# Patient Record
Sex: Female | Born: 1975
Health system: Southern US, Community
[De-identification: ages and names within clinical notes are randomized; demographics above are authoritative.]

## PROBLEM LIST (undated history)

## (undated) DIAGNOSIS — K219 Gastro-esophageal reflux disease without esophagitis: Secondary | ICD-10-CM

## (undated) DIAGNOSIS — E119 Type 2 diabetes mellitus without complications: Secondary | ICD-10-CM

## (undated) DIAGNOSIS — J189 Pneumonia, unspecified organism: Secondary | ICD-10-CM

## (undated) DIAGNOSIS — I10 Essential (primary) hypertension: Secondary | ICD-10-CM

## (undated) DIAGNOSIS — Z309 Encounter for contraceptive management, unspecified: Secondary | ICD-10-CM

## (undated) DIAGNOSIS — E785 Hyperlipidemia, unspecified: Secondary | ICD-10-CM

## (undated) DIAGNOSIS — N898 Other specified noninflammatory disorders of vagina: Secondary | ICD-10-CM

## (undated) DIAGNOSIS — B9689 Other specified bacterial agents as the cause of diseases classified elsewhere: Secondary | ICD-10-CM

## (undated) DIAGNOSIS — R87629 Unspecified abnormal cytological findings in specimens from vagina: Secondary | ICD-10-CM

## (undated) DIAGNOSIS — R87610 Atypical squamous cells of undetermined significance on cytologic smear of cervix (ASC-US): Secondary | ICD-10-CM

## (undated) DIAGNOSIS — R4586 Emotional lability: Secondary | ICD-10-CM

## (undated) DIAGNOSIS — J40 Bronchitis, not specified as acute or chronic: Secondary | ICD-10-CM

## (undated) DIAGNOSIS — Z9889 Other specified postprocedural states: Secondary | ICD-10-CM

## (undated) DIAGNOSIS — N760 Acute vaginitis: Secondary | ICD-10-CM

## (undated) DIAGNOSIS — N939 Abnormal uterine and vaginal bleeding, unspecified: Secondary | ICD-10-CM

## (undated) DIAGNOSIS — R112 Nausea with vomiting, unspecified: Secondary | ICD-10-CM

## (undated) HISTORY — DX: Emotional lability: R45.86

## (undated) HISTORY — DX: Other specified bacterial agents as the cause of diseases classified elsewhere: B96.89

## (undated) HISTORY — PX: EYE SURGERY: SHX253

## (undated) HISTORY — DX: Other specified bacterial agents as the cause of diseases classified elsewhere: N76.0

## (undated) HISTORY — DX: Other specified noninflammatory disorders of vagina: N89.8

## (undated) HISTORY — PX: LAPAROSCOPIC GASTRIC SLEEVE RESECTION: SHX5895

## (undated) HISTORY — DX: Encounter for contraceptive management, unspecified: Z30.9

## (undated) HISTORY — DX: Atypical squamous cells of undetermined significance on cytologic smear of cervix (ASC-US): R87.610

## (undated) HISTORY — DX: Unspecified abnormal cytological findings in specimens from vagina: R87.629

## (undated) HISTORY — DX: Abnormal uterine and vaginal bleeding, unspecified: N93.9

## (undated) HISTORY — PX: WISDOM TOOTH EXTRACTION: SHX21

## (undated) HISTORY — DX: Hyperlipidemia, unspecified: E78.5

## (undated) HISTORY — DX: Type 2 diabetes mellitus without complications: E11.9

---

## 2004-03-24 ENCOUNTER — Ambulatory Visit (HOSPITAL_COMMUNITY): Admission: AD | Admit: 2004-03-24 | Discharge: 2004-03-24 | Payer: Self-pay | Admitting: Internal Medicine

## 2004-06-14 ENCOUNTER — Ambulatory Visit (HOSPITAL_COMMUNITY): Admission: AD | Admit: 2004-06-14 | Discharge: 2004-06-14 | Payer: Self-pay | Admitting: Obstetrics & Gynecology

## 2004-06-17 ENCOUNTER — Ambulatory Visit (HOSPITAL_COMMUNITY): Admission: AD | Admit: 2004-06-17 | Discharge: 2004-06-17 | Payer: Self-pay | Admitting: Obstetrics & Gynecology

## 2004-06-20 ENCOUNTER — Inpatient Hospital Stay (HOSPITAL_COMMUNITY): Admission: EM | Admit: 2004-06-20 | Discharge: 2004-06-23 | Payer: Self-pay | Admitting: Obstetrics & Gynecology

## 2007-08-03 ENCOUNTER — Emergency Department (HOSPITAL_COMMUNITY): Admission: EM | Admit: 2007-08-03 | Discharge: 2007-08-03 | Payer: Self-pay | Admitting: Emergency Medicine

## 2007-08-05 ENCOUNTER — Emergency Department (HOSPITAL_COMMUNITY): Admission: EM | Admit: 2007-08-05 | Discharge: 2007-08-05 | Payer: Self-pay | Admitting: Emergency Medicine

## 2008-11-20 ENCOUNTER — Emergency Department (HOSPITAL_COMMUNITY): Admission: EM | Admit: 2008-11-20 | Discharge: 2008-11-20 | Payer: Self-pay | Admitting: Family Medicine

## 2009-04-22 ENCOUNTER — Emergency Department (HOSPITAL_COMMUNITY): Admission: EM | Admit: 2009-04-22 | Discharge: 2009-04-22 | Payer: Self-pay | Admitting: Family Medicine

## 2009-09-06 ENCOUNTER — Other Ambulatory Visit: Admission: RE | Admit: 2009-09-06 | Discharge: 2009-09-06 | Payer: Self-pay | Admitting: Obstetrics and Gynecology

## 2010-08-01 ENCOUNTER — Ambulatory Visit: Payer: Self-pay | Admitting: Dietician

## 2010-08-11 ENCOUNTER — Encounter: Payer: Commercial Managed Care - PPO | Attending: Endocrinology | Admitting: Dietician

## 2010-08-11 DIAGNOSIS — E109 Type 1 diabetes mellitus without complications: Secondary | ICD-10-CM | POA: Insufficient documentation

## 2010-08-11 DIAGNOSIS — Z713 Dietary counseling and surveillance: Secondary | ICD-10-CM | POA: Insufficient documentation

## 2010-08-11 DIAGNOSIS — I10 Essential (primary) hypertension: Secondary | ICD-10-CM | POA: Insufficient documentation

## 2010-08-11 DIAGNOSIS — E785 Hyperlipidemia, unspecified: Secondary | ICD-10-CM | POA: Insufficient documentation

## 2010-11-04 NOTE — Op Note (Signed)
NAMESALAYA, HOLTROP                   ACCOUNT NO.:  [id number]   MEDICAL RECORD NO.:  [id number]          PATIENT TYPE:  INP   LOCATION:  A415                          FACILITY:  [facility]   PHYSICIAN:  Lazaro Arms, M.D.   DATE OF BIRTH:  03/28/1976   DATE OF PROCEDURE:  06/20/2004  DATE OF DISCHARGE:                                 OPERATIVE REPORT   PREOPERATIVE DIAGNOSES:  1.  Intrauterine pregnancy at 52 weeks' gestation.  2.  Premature rupture of the membranes.  3.  Inadequate pelvis.  4.  Class B diabetic.   POSTOPERATIVE DIAGNOSES:  1.  Intrauterine pregnancy at 65 weeks' gestation.  2.  Premature rupture of the membranes.  3.  Inadequate pelvis.  4.  Class B diabetic.  5.  Right oblique lie.   PROCEDURE:  Primary cesarean section.   SURGEON:  Lazaro Arms, M.D.   ANESTHESIA:  Spinal.   FINDINGS:  Over a low transverse hysterotomy incision was delivered a viable  female at 12:29 with Apgars of 8 and 9, weight to be determined in the  nursery.  There was a three-vessel cord, cord blood and cord gas were sent.  The placenta was sent to pathology per protocol.  The uterus, tubes, and  ovaries were all normal.   DESCRIPTION OF OPERATION:  The patient was taken to the operating room and  underwent a spinal anesthetic, placed in a supine position with a roll under  her right hip.  She was shaved, prepped, a Foley catheter was placed.  She  underwent DuraPrep x2 and draped in the usual sterile fashion.  A  Pfannenstiel skin incision was made and carried down sharply to the rectus  fascia, which was scored in the midline and extended laterally.  The fascia  was taken off the muscles superiorly and inferiorly without difficulty, and  the muscles were divided.  The peritoneal cavity was entered, a bladder  blade was placed.  A vesicouterine serosal flap was created.  A low  transverse hysterotomy incision was made.  Over this incision with vacuum  assistance, a live female was  delivered at 12:29 with Apgars of 8 and 9.  Dr. Milford Cage was in attendance for routine neonatal resuscitation.  The cord was  clamped and the infant was handed to Dr. Milford Cage.  The placenta was delivered  spontaneously and sent to pathology.  Cord blood and cord gas were sent.  The uterus was exteriorized, wiped clean with a clean lap pad, and closed in  two layers, the first being a running interlocking layer, the second being  an imbricating layer.  The uterus was placed in the peritoneal cavity.  Both  paracolic gutters were irrigated.  The uterine incision was hemostatic.  The  muscles and peritoneum were reapproximated loosely using 0 chromic.  The  fascia was closed using 0 Vicryl running, the subcutaneous tissue was made  hemostatic and irrigated.  The skin was closed using skin staples.  The  patient  tolerated the procedure well.  She experienced 750 mL of blood loss and was  taken to recovery in good, stable condition.  All counts were correct x3.  She received Ancef prophylactically, Pitocin after the cord was clamped.  There were no complications.     Luth   LHE/MEDQ  D:  06/20/2004  T:  06/20/2004  Job:  [id number]

## 2010-11-04 NOTE — Discharge Summary (Signed)
NAMELAURRIE, TOPPIN                   ACCOUNT NO.:  [id number]   MEDICAL RECORD NO.:  [id number]          PATIENT TYPE:  INP   LOCATION:  A413                          FACILITY:  [facility]   PHYSICIAN:  Tilda Burrow, M.D. DATE OF BIRTH:  1976-03-10   DATE OF ADMISSION:  06/20/2004  DATE OF DISCHARGE:  01/03/2006LH                                 DISCHARGE SUMMARY   ADMITTING DIAGNOSES:  1.  Pregnancy at 36 weeks 1 Tagliaferri.  2.  Preterm rupture of membranes.  3.  Class B diabetes.  4.  Suspected inadequate pelvis.   DISCHARGE DIAGNOSES:  1.  Pregnancy at 36 weeks, delivered.  2.  Preterm rupture of membranes.  3.  Class B diabetes.  4.  Inadequate pelvis.  5.  Postoperative thrombocytopenia, recovered.   PROCEDURE:  Primary low transverse cervical cesarean section, June 20, 2004, Dr. Lazaro Arms.   DISCHARGE MEDICATIONS:  1.  Sliding-scale insulin based on hospital protocol to be continued until      followup visit, June 27, 2004.  2.  Tylox 1 q.4 h. p.r.n. pain, dispensed 20.   HOSPITAL SUMMARY:  This 35 year old primigravida female , due July 19, 2004, was admitted on June 20, 2004 complaining of membrane rupture after  a pregnancy course followed through our office and notable for insulin  therapy with a.m. insulins, 24 NPH, 14 Regular; 20 NPH and 20 of Regular in  the evenings.  She had additional labs consisting of blood type O-positive,  hemoglobin 14, hematocrit 42, initially; negative hepatitis, HIV, GC,  Chlamydia, RPR and MSAFP.  Hemoglobin A1c was originally 8.7 in July, 6.7 in  August and 7.0 in November.  When she presented with membrane rupture, she  was taken to cesarean delivery due to suspected contracted pelvis and  presenting part well out of the pelvis.  Primary cesarean section delivered  a healthy female infant, Apgars 9/9, that did well.   Her postpartum course was notable for unexplained thrombocytopenia;  platelets were 168,000 per milliliters  upon admission, dropped to 76,000,  postop [place] 1, rechecked at 65,000.  Over the next 2 days, they gradually  increased to 81,000, then 115,000 at the date of discharge.  Postoperative  hematocrit was 33.9%, as compared to 37% preop.  Otherwise, the patient did  excellently on a sliding-scale insulin.  She was discharged home to continue  a sliding  scale according to hospital protocol and she will bring these records to the  followup visit with Dr. Despina Hidden, June 27, 2004, whereupon b.i.d. dosing can  be resumed.  Prior to pregnancy, the patient was on Humulin 70/30 mix twice  daily, but she does not recall the doses.     John   JVF/MEDQ  D:  06/23/2004  T:  06/23/2004  Job:  [id number]

## 2011-03-10 LAB — CBC
HCT: 45.7
Hemoglobin: 15.8 — ABNORMAL HIGH
MCHC: 34.5
MCV: 85.5
RDW: 12.6

## 2011-03-10 LAB — DIFFERENTIAL
Basophils Absolute: 0.1
Basophils Relative: 0
Eosinophils Absolute: 0.1
Eosinophils Relative: 1
Monocytes Absolute: 0.4
Monocytes Relative: 3

## 2011-03-10 LAB — BASIC METABOLIC PANEL
CO2: 23
Calcium: 10.2
Chloride: 98
Glucose, Bld: 328 — ABNORMAL HIGH
Potassium: 3.9
Sodium: 134 — ABNORMAL LOW

## 2011-04-09 ENCOUNTER — Emergency Department (HOSPITAL_COMMUNITY)
Admission: EM | Admit: 2011-04-09 | Discharge: 2011-04-09 | Disposition: A | Discharge status: Home / Self Care | Payer: 59 | Attending: Emergency Medicine | Admitting: Emergency Medicine

## 2011-04-09 ENCOUNTER — Emergency Department (HOSPITAL_COMMUNITY): Payer: 59

## 2011-04-09 ENCOUNTER — Encounter: Payer: Self-pay | Admitting: Emergency Medicine

## 2011-04-09 DIAGNOSIS — E119 Type 2 diabetes mellitus without complications: Secondary | ICD-10-CM | POA: Insufficient documentation

## 2011-04-09 DIAGNOSIS — Z87891 Personal history of nicotine dependence: Secondary | ICD-10-CM | POA: Insufficient documentation

## 2011-04-09 DIAGNOSIS — R059 Cough, unspecified: Secondary | ICD-10-CM | POA: Insufficient documentation

## 2011-04-09 DIAGNOSIS — R05 Cough: Secondary | ICD-10-CM

## 2011-04-09 MED ORDER — ALBUTEROL SULFATE HFA 108 (90 BASE) MCG/ACT IN AERS
2.0000 | INHALATION_SPRAY | Freq: Once | RESPIRATORY_TRACT | Status: AC
  Administered 2011-04-09: 2 via RESPIRATORY_TRACT
  Filled 2011-04-09: qty 6.7

## 2011-04-09 MED ORDER — AZITHROMYCIN 250 MG PO TABS: ORAL_TABLET | ORAL | Status: DC

## 2011-04-09 NOTE — ED Notes (Signed)
Patient c/o nonproductive cough x2 weeks. Patient denies any fevers. Per patient taking robitussin and mucinex with no relief.

## 2011-04-09 NOTE — ED Notes (Signed)
Waiting on respiratory

## 2011-04-09 NOTE — ED Provider Notes (Signed)
History     CSN: [id number] Arrival date & time: 04/09/2011  5:20 PM   First MD Initiated Contact with Patient 04/09/11 1805      Chief Complaint  Patient presents with  . Cough    (Consider location/radiation/quality/duration/timing/severity/associated sxs/prior treatment) Patient is a 35 y.o. female presenting with cough. The history is provided by the patient.  Cough This is a new problem. The cough is non-productive. There has been no fever. Pertinent negatives include no chest pain, no chills, no sweats, no ear congestion, no ear pain, no headaches, no rhinorrhea, no sore throat, no myalgias, no shortness of breath, no wheezing and no eye redness. She has tried decongestants and cough syrup for the symptoms. The treatment provided no relief. She is not a smoker. Her past medical history is significant for bronchitis. Her past medical history does not include pneumonia, COPD or asthma.    Past Medical History  Diagnosis Date  . Diabetes mellitus     History reviewed. No pertinent past surgical history.  Family History  Problem Relation Age of Onset  . Cancer Mother   . Hypertension Mother   . Diabetes Mother   . Diabetes Father   . Diabetes Sister     History  Substance Use Topics  . Smoking status: Former Smoker -- 0.5 packs/[place] for 15 years    Types: Cigarettes    Quit date: 01/18/2011  . Smokeless tobacco: Not on file  . Alcohol Use: No    OB History    Grav Para Term Preterm Abortions TAB SAB Ect Mult Living   1 1  1      1       Review of Systems  Constitutional: Negative for fever, chills and fatigue.  HENT: Negative for ear pain, congestion, sore throat, rhinorrhea, trouble swallowing, neck pain and neck stiffness.   Eyes: Negative for redness.  Respiratory: Positive for cough. Negative for chest tightness, shortness of breath and wheezing.   Cardiovascular: Negative for chest pain and palpitations.  Gastrointestinal: Negative for abdominal pain.    Genitourinary: Negative for dysuria, hematuria, flank pain, vaginal bleeding, vaginal discharge, difficulty urinating and vaginal pain.  Musculoskeletal: Negative for myalgias, back pain and arthralgias.  Skin: Negative for rash.  Neurological: Negative for dizziness, weakness, numbness and headaches.  Hematological: Does not bruise/bleed easily.  All other systems reviewed and are negative.    Allergies  Penicillins  Home Medications  No current outpatient prescriptions on file.  BP 162/83  Pulse 102  Temp 98.8 F (37.1 C)  Resp 16  Ht 5\' 1"  (1.549 m)  Wt 205 lb (92.987 kg)  BMI 38.73 kg/m2  SpO2 100%  LMP 04/07/2011  Physical Exam  Nursing note and vitals reviewed. Constitutional: She is oriented to person, place, and time. She appears well-developed and well-nourished. No distress.  HENT:  Head: Normocephalic and atraumatic.  Mouth/Throat: Oropharynx is clear and moist.  Neck: Normal range of motion. Neck supple.  Cardiovascular: Normal rate, regular rhythm and normal heart sounds.   Pulmonary/Chest: Effort normal and breath sounds normal. No respiratory distress. She has no wheezes. She has no rales. She exhibits no tenderness.       Active coughing.  Coarse lung sounds  Abdominal: Soft. She exhibits no distension. There is no tenderness.  Musculoskeletal: Normal range of motion. She exhibits no tenderness.  Lymphadenopathy:    She has no cervical adenopathy.  Neurological: She is alert and oriented to person, place, and time. No cranial nerve deficit.  She exhibits normal muscle tone. Coordination normal.  Skin: Skin is warm and dry.    ED Course  Procedures (including critical care time)  Dg Chest 2 View  04/09/2011  *RADIOLOGY REPORT*  Clinical Data: Cough.  CHEST - 2 VIEW  Comparison: None.  Findings: Lungs are clear.  Heart size is normal.  No pneumothorax or pleural effusion.  IMPRESSION: Negative chest.  Original Report Authenticated By: Bernadene Bell.  Maricela Curet, M.D.        MDM    6:27 PM patient is alert, NAD.  Vitals stable.  Few expiratory wheezes throughout.  No hypoxia.    Patient / Family / Caregiver understand and agree with initial ED impression and plan with expectations set for ED visit.   OUTPATIENT MEDICATIONS PRESCRIBED FROM THE ED:   New Prescriptions   AZITHROMYCIN (ZITHROMAX Z-PAK) 250 MG TABLET    Take two tablets on [place] one, then one tab qd days 2-5           Brindy Higginbotham L. Chinyere Galiano, [place] 04/14/11 1423

## 2011-04-18 NOTE — ED Provider Notes (Signed)
Medical screening examination/treatment/procedure(s) were performed by non-physician practitioner and as supervising physician I was immediately available for consultation/collaboration.  Yonah Tangeman, MD 04/18/11 1638 

## 2011-07-27 DIAGNOSIS — R072 Precordial pain: Secondary | ICD-10-CM

## 2011-08-10 ENCOUNTER — Other Ambulatory Visit (HOSPITAL_COMMUNITY)
Admission: RE | Admit: 2011-08-10 | Discharge: 2011-08-10 | Disposition: A | Discharge status: Home / Self Care | Payer: 59 | Source: Ambulatory Visit | Attending: Obstetrics and Gynecology | Admitting: Obstetrics and Gynecology

## 2011-08-10 ENCOUNTER — Other Ambulatory Visit: Payer: Self-pay | Admitting: Adult Health

## 2011-08-10 DIAGNOSIS — Z01419 Encounter for gynecological examination (general) (routine) without abnormal findings: Secondary | ICD-10-CM | POA: Insufficient documentation

## 2011-08-10 DIAGNOSIS — Z113 Encounter for screening for infections with a predominantly sexual mode of transmission: Secondary | ICD-10-CM | POA: Insufficient documentation

## 2011-09-11 ENCOUNTER — Other Ambulatory Visit: Payer: Self-pay | Admitting: Ophthalmology

## 2011-09-12 ENCOUNTER — Other Ambulatory Visit (HOSPITAL_COMMUNITY): Payer: Self-pay | Admitting: *Deleted

## 2011-09-12 ENCOUNTER — Other Ambulatory Visit: Payer: Self-pay | Admitting: Ophthalmology

## 2011-09-12 ENCOUNTER — Encounter (HOSPITAL_COMMUNITY): Payer: Self-pay | Admitting: *Deleted

## 2011-09-12 MED ORDER — PHENYLEPHRINE HCL 2.5 % OP SOLN
1.0000 [drp] | OPHTHALMIC | Status: AC | PRN
  Administered 2011-09-13 (×3): 1 [drp] via OPHTHALMIC
  Filled 2011-09-12: qty 3

## 2011-09-12 MED ORDER — ATROPINE SULFATE 1 % OP SOLN
1.0000 [drp] | OPHTHALMIC | Status: AC | PRN
  Administered 2011-09-13 (×3): 1 [drp] via OPHTHALMIC
  Filled 2011-09-12: qty 2

## 2011-09-12 MED ORDER — GATIFLOXACIN 0.5 % OP SOLN
1.0000 [drp] | OPHTHALMIC | Status: AC | PRN
  Administered 2011-09-13 (×3): 1 [drp] via OPHTHALMIC
  Filled 2011-09-12: qty 2.5

## 2011-09-12 MED ORDER — TROPICAMIDE 1 % OP SOLN
1.0000 [drp] | OPHTHALMIC | Status: AC | PRN
  Administered 2011-09-13 (×3): 1 [drp] via OPHTHALMIC

## 2011-09-12 MED ORDER — PROPARACAINE HCL 0.5 % OP SOLN
1.0000 [drp] | OPHTHALMIC | Status: AC | PRN
  Administered 2011-09-13 (×3): 1 [drp] via OPHTHALMIC
  Filled 2011-09-12 (×2): qty 15

## 2011-09-12 MED ORDER — KETOROLAC TROMETHAMINE 0.5 % OP SOLN
1.0000 [drp] | OPHTHALMIC | Status: AC
  Administered 2011-09-13: 1 [drp] via OPHTHALMIC
  Filled 2011-09-12: qty 5

## 2011-09-12 NOTE — Progress Notes (Signed)
Pt has a stress test approx 2 years ago at [facility]- ordered by medical MD, pt states that it was normal.  I  Faxed a request for results to [facility].

## 2011-09-12 NOTE — Progress Notes (Signed)
Clarified with office- PDR is Proliferative Diabetic Retinopathy.  (Consent)

## 2011-09-13 ENCOUNTER — Encounter (HOSPITAL_COMMUNITY): Payer: Self-pay | Admitting: Ophthalmology

## 2011-09-13 ENCOUNTER — Encounter (HOSPITAL_COMMUNITY)
Admission: RE | Disposition: A | Discharge status: Home / Self Care | Payer: Self-pay | Source: Ambulatory Visit | Attending: Ophthalmology

## 2011-09-13 ENCOUNTER — Ambulatory Visit (HOSPITAL_COMMUNITY)
Admission: RE | Admit: 2011-09-13 | Discharge: 2011-09-13 | Disposition: A | Discharge status: Home / Self Care | Payer: 59 | Source: Ambulatory Visit | Attending: Ophthalmology | Admitting: Ophthalmology

## 2011-09-13 ENCOUNTER — Ambulatory Visit (HOSPITAL_COMMUNITY): Payer: 59 | Admitting: Anesthesiology

## 2011-09-13 ENCOUNTER — Other Ambulatory Visit: Payer: Self-pay

## 2011-09-13 ENCOUNTER — Encounter (HOSPITAL_COMMUNITY): Payer: Self-pay | Admitting: Anesthesiology

## 2011-09-13 ENCOUNTER — Encounter (HOSPITAL_COMMUNITY): Payer: 59

## 2011-09-13 DIAGNOSIS — F430 Acute stress reaction: Secondary | ICD-10-CM | POA: Insufficient documentation

## 2011-09-13 DIAGNOSIS — E11359 Type 2 diabetes mellitus with proliferative diabetic retinopathy without macular edema: Secondary | ICD-10-CM | POA: Insufficient documentation

## 2011-09-13 DIAGNOSIS — H4310 Vitreous hemorrhage, unspecified eye: Secondary | ICD-10-CM | POA: Insufficient documentation

## 2011-09-13 DIAGNOSIS — H33309 Unspecified retinal break, unspecified eye: Secondary | ICD-10-CM | POA: Insufficient documentation

## 2011-09-13 DIAGNOSIS — E1139 Type 2 diabetes mellitus with other diabetic ophthalmic complication: Secondary | ICD-10-CM | POA: Insufficient documentation

## 2011-09-13 DIAGNOSIS — Z87891 Personal history of nicotine dependence: Secondary | ICD-10-CM | POA: Insufficient documentation

## 2011-09-13 DIAGNOSIS — I10 Essential (primary) hypertension: Secondary | ICD-10-CM | POA: Insufficient documentation

## 2011-09-13 DIAGNOSIS — H33009 Unspecified retinal detachment with retinal break, unspecified eye: Secondary | ICD-10-CM | POA: Insufficient documentation

## 2011-09-13 HISTORY — PX: PARS PLANA VITRECTOMY: SHX2166

## 2011-09-13 HISTORY — DX: Gastro-esophageal reflux disease without esophagitis: K21.9

## 2011-09-13 HISTORY — DX: Essential (primary) hypertension: I10

## 2011-09-13 LAB — CBC
HCT: 39.8 % (ref 36.0–46.0)
MCH: 29.2 pg (ref 26.0–34.0)
MCV: 85.4 fL (ref 78.0–100.0)
RDW: 13.7 % (ref 11.5–15.5)
WBC: 6.3 10*3/uL (ref 4.0–10.5)

## 2011-09-13 LAB — BASIC METABOLIC PANEL
BUN: 14 mg/dL (ref 6–23)
CO2: 23 mEq/L (ref 19–32)
Chloride: 108 mEq/L (ref 96–112)
Creatinine, Ser: 0.94 mg/dL (ref 0.50–1.10)
Glucose, Bld: 138 mg/dL — ABNORMAL HIGH (ref 70–99)

## 2011-09-13 LAB — SURGICAL PCR SCREEN: Staphylococcus aureus: POSITIVE — AB

## 2011-09-13 LAB — GLUCOSE, CAPILLARY

## 2011-09-13 SURGERY — PARS PLANA VITRECTOMY WITH 25 GAUGE
Anesthesia: General | Laterality: Right

## 2011-09-13 MED ORDER — MUPIROCIN 2 % EX OINT
TOPICAL_OINTMENT | CUTANEOUS | Status: AC
  Filled 2011-09-13: qty 22

## 2011-09-13 MED ORDER — TROPICAMIDE 1 % OP SOLN
OPHTHALMIC | Status: AC
  Administered 2011-09-13: 1 [drp] via OPHTHALMIC
  Filled 2011-09-13: qty 3

## 2011-09-13 MED ORDER — MUPIROCIN 2 % EX OINT
TOPICAL_OINTMENT | Freq: Once | CUTANEOUS | Status: AC
  Administered 2011-09-13: 14:00:00 via NASAL

## 2011-09-13 MED ORDER — ONDANSETRON HCL 4 MG/2ML IJ SOLN: 4.0000 mg | Freq: Four times a day (QID) | INTRAMUSCULAR | Status: DC | PRN

## 2011-09-13 MED ORDER — SODIUM CHLORIDE 0.9 % IV SOLN
INTRAVENOUS | Status: DC | PRN
  Administered 2011-09-13 (×2): via INTRAVENOUS

## 2011-09-13 MED ORDER — CIPROFLOXACIN IN D5W 400 MG/200ML IV SOLN
INTRAVENOUS | Status: DC | PRN
  Administered 2011-09-13: 400 mg via INTRAVENOUS

## 2011-09-13 MED ORDER — SODIUM CHLORIDE 0.9 % IV SOLN
INTRAVENOUS | Status: DC
  Administered 2011-09-13: 15:00:00 via INTRAVENOUS

## 2011-09-13 MED ORDER — PHENYLEPHRINE HCL 10 MG/ML IJ SOLN
INTRAMUSCULAR | Status: DC | PRN
  Administered 2011-09-13: 80 ug via INTRAVENOUS

## 2011-09-13 MED ORDER — LACTATED RINGERS IV SOLN: INTRAVENOUS | Status: DC

## 2011-09-13 MED ORDER — MIDAZOLAM HCL 5 MG/5ML IJ SOLN
INTRAMUSCULAR | Status: DC | PRN
  Administered 2011-09-13 (×2): 1 mg via INTRAVENOUS

## 2011-09-13 MED ORDER — NEOSTIGMINE METHYLSULFATE 1 MG/ML IJ SOLN
INTRAMUSCULAR | Status: DC | PRN
  Administered 2011-09-13: 5 mg via INTRAVENOUS

## 2011-09-13 MED ORDER — HYDROMORPHONE HCL PF 1 MG/ML IJ SOLN: 0.2500 mg | INTRAMUSCULAR | Status: DC | PRN

## 2011-09-13 MED ORDER — GLYCOPYRROLATE 0.2 MG/ML IJ SOLN
INTRAMUSCULAR | Status: DC | PRN
  Administered 2011-09-13: .6 mg via INTRAVENOUS

## 2011-09-13 MED ORDER — PROPOFOL 10 MG/ML IV EMUL
INTRAVENOUS | Status: DC | PRN
  Administered 2011-09-13: 200 mg via INTRAVENOUS

## 2011-09-13 MED ORDER — ESMOLOL HCL 10 MG/ML IV SOLN
INTRAVENOUS | Status: DC | PRN
  Administered 2011-09-13: 20 mg via INTRAVENOUS
  Administered 2011-09-13: 30 mg via INTRAVENOUS

## 2011-09-13 MED ORDER — INDOCYANINE GREEN 25 MG IV SOLR
25.0000 mg | INTRAVENOUS | Status: DC
  Filled 2011-09-13: qty 25

## 2011-09-13 MED ORDER — DEXAMETHASONE SODIUM PHOSPHATE 10 MG/ML IJ SOLN
INTRAMUSCULAR | Status: DC | PRN
  Administered 2011-09-13: 10 mg

## 2011-09-13 MED ORDER — FENTANYL CITRATE 0.05 MG/ML IJ SOLN
INTRAMUSCULAR | Status: DC | PRN
  Administered 2011-09-13: 50 ug via INTRAVENOUS
  Administered 2011-09-13: 100 ug via INTRAVENOUS
  Administered 2011-09-13 (×2): 50 ug via INTRAVENOUS

## 2011-09-13 MED ORDER — ONDANSETRON HCL 4 MG/2ML IJ SOLN
INTRAMUSCULAR | Status: DC | PRN
  Administered 2011-09-13: 4 mg via INTRAVENOUS

## 2011-09-13 MED ORDER — CIPROFLOXACIN IN D5W 400 MG/200ML IV SOLN
INTRAVENOUS | Status: AC
  Filled 2011-09-13: qty 200

## 2011-09-13 MED ORDER — ROCURONIUM BROMIDE 100 MG/10ML IV SOLN
INTRAVENOUS | Status: DC | PRN
  Administered 2011-09-13: 30 mg via INTRAVENOUS

## 2011-09-13 MED ORDER — INDOCYANINE GREEN 25 MG IV SOLR
2.5000 mg | INTRAVENOUS | Status: DC
  Filled 2011-09-13: qty 25

## 2011-09-13 MED ORDER — EPINEPHRINE HCL 1 MG/ML IJ SOLN
INTRAOCULAR | Status: DC | PRN
  Administered 2011-09-13: 16:00:00

## 2011-09-13 MED ORDER — HYPROMELLOSE (GONIOSCOPIC) 2.5 % OP SOLN
OPHTHALMIC | Status: DC | PRN
  Administered 2011-09-13: 1 [drp] via OPHTHALMIC

## 2011-09-13 MED ORDER — LIDOCAINE HCL (CARDIAC) 20 MG/ML IV SOLN
INTRAVENOUS | Status: DC | PRN
  Administered 2011-09-13: 100 mg via INTRAVENOUS

## 2011-09-13 SURGICAL SUPPLY — 58 items
ACCESSORY FRAGMATOME (MISCELLANEOUS) IMPLANT
APL SRG 3 HI ABS STRL LF PLS (MISCELLANEOUS)
APPLICATOR COTTON TIP 6IN STRL (MISCELLANEOUS) ×2 IMPLANT
APPLICATOR DR MATTHEWS STRL (MISCELLANEOUS) IMPLANT
CANNULA ANT CHAM MAIN (OPHTHALMIC RELATED) IMPLANT
CANNULA FLEX TIP 25G (CANNULA) IMPLANT
CLOTH BEACON ORANGE TIMEOUT ST (SAFETY) ×2 IMPLANT
CORDS BIPOLAR (ELECTRODE) IMPLANT
DRAPE OPHTHALMIC 77X100 STRL (CUSTOM PROCEDURE TRAY) ×2 IMPLANT
FILTER BLUE MILLIPORE (MISCELLANEOUS) IMPLANT
FORCEPS ECKARDT ILM 25G SERR (OPHTHALMIC RELATED) IMPLANT
FORCEPS HORIZONTAL 25G DISP (OPHTHALMIC RELATED) IMPLANT
GAS OPHTHALMIC (MISCELLANEOUS) IMPLANT
GLOVE SS BIOGEL STRL SZ 8 (GLOVE) ×1 IMPLANT
GLOVE SUPERSENSE BIOGEL SZ 8 (GLOVE) ×1
GOWN STRL NON-REIN LRG LVL3 (GOWN DISPOSABLE) ×2 IMPLANT
ILLUMINATOR ENDO 25GA (MISCELLANEOUS) ×2 IMPLANT
KIT BASIN OR (CUSTOM PROCEDURE TRAY) ×2 IMPLANT
KIT PERFLUORON PROCEDURE 5ML (MISCELLANEOUS) IMPLANT
KIT ROOM TURNOVER OR (KITS) ×2 IMPLANT
KNIFE CRESCENT 2.5 55 ANG (BLADE) IMPLANT
LENS BIOM SUPER VIEW SET DISP (OPHTHALMIC RELATED) ×3 IMPLANT
MARKER SKIN DUAL TIP RULER LAB (MISCELLANEOUS) IMPLANT
MASK EYE SHIELD (GAUZE/BANDAGES/DRESSINGS) ×1 IMPLANT
MICROPICK 25G (MISCELLANEOUS)
NDL 18GX1X1/2 (RX/OR ONLY) (NEEDLE) IMPLANT
NDL 25GX 5/8IN NON SAFETY (NEEDLE) IMPLANT
NDL FILTER BLUNT 18X1 1/2 (NEEDLE) IMPLANT
NDL HYPO 25GX1X1/2 BEV (NEEDLE) IMPLANT
NEEDLE 18GX1X1/2 (RX/OR ONLY) (NEEDLE) IMPLANT
NEEDLE 25GX 5/8IN NON SAFETY (NEEDLE) IMPLANT
NEEDLE FILTER BLUNT 18X 1/2SAF (NEEDLE)
NEEDLE FILTER BLUNT 18X1 1/2 (NEEDLE) IMPLANT
NEEDLE HYPO 25GX1X1/2 BEV (NEEDLE) IMPLANT
NS IRRIG 1000ML POUR BTL (IV SOLUTION) ×2 IMPLANT
PACK VITRECTOMY CUSTOM (CUSTOM PROCEDURE TRAY) ×2 IMPLANT
PAD ARMBOARD 7.5X6 YLW CONV (MISCELLANEOUS) ×4 IMPLANT
PAD EYE OVAL STERILE LF (GAUZE/BANDAGES/DRESSINGS) ×1 IMPLANT
PAK VITRECTOMY PIK 25 GA (OPHTHALMIC RELATED) ×2 IMPLANT
PENCIL BIPOLAR 25GA STR DISP (OPHTHALMIC RELATED) IMPLANT
PICK MICROPICK 25G (MISCELLANEOUS) IMPLANT
PROBE DIRECTIONAL LASER (MISCELLANEOUS) ×1 IMPLANT
ROLLS DENTAL (MISCELLANEOUS) IMPLANT
SCRAPER DIAMOND 25GA (OPHTHALMIC RELATED) IMPLANT
SET FLUID INJECTOR (SET/KITS/TRAYS/PACK) IMPLANT
STOCKINETTE IMPERVIOUS 9X36 MD (GAUZE/BANDAGES/DRESSINGS) ×4 IMPLANT
STOPCOCK 4 WAY LG BORE MALE ST (IV SETS) IMPLANT
SUT ETHILON 10 0 CS140 6 (SUTURE) IMPLANT
SUT ETHILON 8 0 BV130 4 (SUTURE) IMPLANT
SUT MERSILENE 5 0 RD 1 DA (SUTURE) IMPLANT
SUT PROLENE 10 0 CIF 4 DA (SUTURE) IMPLANT
SUT VICRYL 7 0 TG140 8 (SUTURE) IMPLANT
SYR 30ML SLIP (SYRINGE) IMPLANT
SYR 5ML LL (SYRINGE) IMPLANT
SYR TB 1ML LUER SLIP (SYRINGE) IMPLANT
TOWEL OR 17X24 6PK STRL BLUE (TOWEL DISPOSABLE) ×4 IMPLANT
WATER STERILE IRR 1000ML POUR (IV SOLUTION) ×2 IMPLANT
WIPE INSTRUMENT VISIWIPE 73X73 (MISCELLANEOUS) ×2 IMPLANT

## 2011-09-13 NOTE — Brief Op Note (Signed)
09/13/2011  4:30 PM  PATIENT:  Connie Aguilar  36 y.o. female  PRE-OPERATIVE DIAGNOSIS:  Vitreous hemorrhage with traction retinal detachment and proliferative diabetic retinopathy right eye  POST-OPERATIVE DIAGNOSIS:  Vitreous hemorrhage with traction retinal detachment and proliferative diabetic retinopathy, right eye, with local inferior retinal break.  PROCEDURE:  Procedure(s) (LRB): PARS PLANA VITRECTOMY WITH 25 GAUGE (Right) with panretinal endo LASER PHOTO ABLATION (Right) and  MEMBRANE PEEL (Right) and injection of gas, air 75% fill  SURGEON:  Surgeon(s) and Role:    * Edmon Crape, MD - Primary  PHYSICIAN ASSISTANT:   ASSISTANTS: none   ANESTHESIA:   general  EBL:  Total I/O In: 700 [I.V.:700] Out: -   BLOOD ADMINISTERED:none  DRAINS: none   LOCAL MEDICATIONS USED:  NONE  SPECIMEN:  No Specimen  DISPOSITION OF SPECIMEN:  N/A  COUNTS:  YES  TOURNIQUET:  * No tourniquets in log *  DICTATION: .Other Dictation: Dictation Number [id number]  PLAN OF CARE: Discharge to home after PACU  PATIENT DISPOSITION:  PACU - hemodynamically stable.   Delay start of Pharmacological VTE agent (>24hrs) due to surgical blood loss or risk of bleeding: not applicable

## 2011-09-13 NOTE — H&P (Signed)
Connie Aguilar is an 36 y.o. female.   Chief Complaint profound vision loss, and vitreous hemorrhage RIGHT EYE  HPI: PROGRESSIVE vision loss OD, from proliferative diabetic retinopathy progression OU,now with vitreous hemorrhage OD  Past Medical History  Diagnosis Date  . Diabetes mellitus   . GERD (gastroesophageal reflux disease)   . Hypertension     Past Surgical History  Procedure Date  . Wisdom tooth extraction   . Eye surgery march 2013, laser prp  OU    Family History  Problem Relation Age of Onset  . Cancer Mother   . Hypertension Mother   . Diabetes Mother   . Diabetes Father   . Diabetes Sister   . Anesthesia problems Neg Hx    Social History:  reports that she quit smoking about 7 months ago. Her smoking use included Cigarettes. She has a 7.5 pack-year smoking history. She does not have any smokeless tobacco history on file. She reports that she does not drink alcohol or use illicit drugs.  Allergies:  Allergies  Allergen Reactions  . Penicillins Rash    Medications Prior to Admission  Medication Dose Route Frequency Provider Last Rate Last Dose  . atropine 1 % ophthalmic solution 1 drop  1 drop Right Eye PRN Edmon Crape, MD      . gatifloxacin (ZYMAXID) 0.5 % ophthalmic drops 1 drop  1 drop Right Eye PRN Edmon Crape, MD      . ketorolac (ACULAR) 0.5 % ophthalmic solution 1 drop  1 drop Right Eye On Call to OR Alford Highland Phelan Schadt, MD      . phenylephrine (MYDFRIN) 2.5 % ophthalmic solution 1 drop  1 drop Right Eye PRN Edmon Crape, MD      . proparacaine (ALCAINE) 0.5 % ophthalmic solution 1 drop  1 drop Right Eye PRN Edmon Crape, MD      . tropicamide (MYDRIACYL) 1 % ophthalmic solution 1 drop  1 drop Right Eye PRN Edmon Crape, MD       Medications Prior to Admission  Medication Sig Dispense Refill  . insulin lispro protamine-insulin lispro (HUMALOG 50/50) (50-50) 100 UNIT/ML SUSP Inject 20 Units into the skin at bedtime.      . insulin lispro  protamine-insulin lispro (HUMALOG 75/25) (75-25) 100 UNIT/ML SUSP Inject 36 Units into the skin daily with breakfast.      . ketorolac (ACULAR) 0.4 % SOLN Place 1 drop into the right eye 4 (four) times daily.      Marland Kitchen losartan (COZAAR) 50 MG tablet Take 50 mg by mouth daily.      Marland Kitchen tobramycin (TOBREX) 0.3 % ophthalmic solution Place 1 drop into the right eye every 6 (six) hours.      . Vitamin D, Ergocalciferol, (DRISDOL) 50000 UNITS CAPS Take 50,000 Units by mouth 2 (two) times a week. Take on mondays and fridays.        No results found for this or any previous visit (from the past 48 hour(s)). No results found.  Review of Systems  Constitutional: Negative.   Eyes: Positive for blurred vision.  Respiratory: Negative.   Cardiovascular: Negative.   Skin: Negative.   All other systems reviewed and are negative.    Last menstrual period 08/15/2011. Physical Exam  Constitutional: She is oriented to person, place, and time. Vital signs are normal. She appears well-developed.  HENT:  Head: Normocephalic.  Eyes: Conjunctivae, EOM and lids are normal. Pupils are equal, round, and reactive to  light.  Neck: Trachea normal and normal range of motion.  Cardiovascular: Normal rate, regular rhythm, normal heart sounds and normal pulses.   Respiratory: Effort normal and breath sounds normal.  GI: Soft. Bowel sounds are normal.  Musculoskeletal: Normal range of motion.  Neurological: She is alert and oriented to person, place, and time.  Skin: Skin is warm.  Psychiatric: She has a normal mood and affect. Her behavior is normal. Judgment and thought content normal.     Assessment/Plan DIABETIC VITREOUS HEMORRHAGE AND PROLIFERATIVE DIABETIC RETINOPATHY RIGHT EYE. VITRECTOMY ENDOLASER PANRETINAL PHOTOCOAGULATION RIGHT EYE UNDER GENERAL ANESTHESIA, RIGHT EYE.   Connie Aguilar A 09/13/2011, 12:24 PM

## 2011-09-13 NOTE — Discharge Instructions (Addendum)
Leave eye patch on until follow-up with Dr. Luciana Axe on 09/14/11.

## 2011-09-13 NOTE — Anesthesia Postprocedure Evaluation (Signed)
Anesthesia Post Note  Patient: Connie Aguilar  Procedure(s) Performed: Procedure(s) (LRB): PARS PLANA VITRECTOMY WITH 25 GAUGE (Right) LASER PHOTO ABLATION (Right)  Anesthesia type: General  Patient location: PACU  Post pain: Pain level controlled and Adequate analgesia  Post assessment: Post-op Vital signs reviewed, Patient's Cardiovascular Status Stable, Respiratory Function Stable, Patent Airway and Pain level controlled  Last Vitals:  Filed Vitals:   09/13/11 1700  BP:   Pulse:   Temp: 36 C  Resp:     Post vital signs: Reviewed and stable  Level of consciousness: awake, alert  and oriented  Complications: No apparent anesthesia complications

## 2011-09-13 NOTE — Preoperative (Signed)
Beta Blockers   Reason not to administer Beta Blockers:Not Applicable 

## 2011-09-13 NOTE — Anesthesia Preprocedure Evaluation (Addendum)
Anesthesia Evaluation  Patient identified by MRN, date of birth, ID band Patient awake    Reviewed: Allergy & Precautions, H&P , NPO status , Patient's Chart, lab work & pertinent test results, reviewed documented beta blocker date and time   History of Anesthesia Complications Negative for: history of anesthetic complications  Airway Mallampati: II TM Distance: >3 FB Neck ROM: full    Dental  (+) Teeth Intact and Dental Advisory Given   Pulmonary former smoker         Cardiovascular Exercise Tolerance: Good hypertension, Pt. on medications     Neuro/Psych negative neurological ROS  negative psych ROS   GI/Hepatic Neg liver ROS, GERD-  ,  Endo/Other  Diabetes mellitus-, Well Controlled, Insulin Dependent  Renal/GU negative Renal ROS  negative genitourinary   Musculoskeletal negative musculoskeletal ROS (+)   Abdominal   Peds  Hematology negative hematology ROS (+)   Anesthesia Other Findings   Reproductive/Obstetrics negative OB ROS                         Anesthesia Physical Anesthesia Plan  ASA: III  Anesthesia Plan: General   Post-op Pain Management:    Induction: Intravenous  Airway Management Planned: Oral ETT  Additional Equipment:   Intra-op Plan:   Post-operative Plan: Extubation in OR  Informed Consent: I have reviewed the patients History and Physical, chart, labs and discussed the procedure including the risks, benefits and alternatives for the proposed anesthesia with the patient or authorized representative who has indicated his/her understanding and acceptance.   Dental advisory given  Plan Discussed with: CRNA, Surgeon and Anesthesiologist  Anesthesia Plan Comments:        Anesthesia Quick Evaluation

## 2011-09-13 NOTE — Anesthesia Procedure Notes (Addendum)
Procedure Name: Intubation Date/Time: 09/13/2011 3:37 PM Performed by: Malachi Pro Pre-anesthesia Checklist: Patient identified, Emergency Drugs available, Suction available, Patient being monitored and Timeout performed Patient Re-evaluated:Patient Re-evaluated prior to inductionOxygen Delivery Method: Circle system utilized Preoxygenation: Pre-oxygenation with 100% oxygen Intubation Type: IV induction Ventilation: Oral airway inserted - appropriate to patient size and Mask ventilation without difficulty Laryngoscope Size: Mac and 3 Grade View: Grade II Tube type: Oral Tube size: 7.0 mm Airway Equipment and Method: Stylet Placement Confirmation: ETT inserted through vocal cords under direct vision,  positive ETCO2 and breath sounds checked- equal and bilateral Secured at: 22 cm Dental Injury: Teeth and Oropharynx as per pre-operative assessment    Performed by: Malachi Pro Number of attempts: 1

## 2011-09-13 NOTE — Transfer of Care (Signed)
Immediate Anesthesia Transfer of Care Note  Patient: Connie Aguilar  Procedure(s) Performed: Procedure(s) (LRB): PARS PLANA VITRECTOMY WITH 25 GAUGE (Right) LASER PHOTO ABLATION (Right)  Patient Location: PACU  Anesthesia Type: General  Level of Consciousness: sedated  Airway & Oxygen Therapy: Patient Spontanous Breathing and Patient connected to face mask oxygen  Post-op Assessment: Report given to PACU RN and Post -op Vital signs reviewed and stable  Post vital signs: Reviewed and stable  Complications: No apparent anesthesia complications

## 2011-09-14 ENCOUNTER — Encounter (HOSPITAL_COMMUNITY): Payer: Self-pay | Admitting: Ophthalmology

## 2011-09-14 NOTE — Op Note (Signed)
Connie Aguilar, Connie Aguilar                   ACCOUNT NO.:  [id number]  MEDICAL RECORD NO.:  [id number]  LOCATION:  [facility]                         FACILITY:  [facility]  PHYSICIAN:  Jillyn Hidden A. Angelica Wix, M.D.   DATE OF BIRTH:  1975-07-23  DATE OF PROCEDURE: DATE OF DISCHARGE:  09/13/2011                              OPERATIVE REPORT   PREOPERATIVE DIAGNOSES: 1. Dense vitreous hemorrhage, right eye - nonclearing. 2. History of progressive proliferative diabetic retinopathy, advanced     right eye. 3. Traction retinal detachment, right eye; peripherally, temporally     basis of ultrasound examination.  POSTOPERATIVE DIAGNOSES: 1. Dense vitreous hemorrhage, right eye - nonclearing. 2. History of progressive proliferative diabetic retinopathy, advanced     right eye. 3. Traction retinal detachment, right eye; peripherally, temporally     basis of ultrasound examination. 4. Local retinal break at the 7 o'clock position.  I treated it easily     with laser retinopexy.  PROCEDURE:  Posterior vitrectomy with membrane peel - 25-gauge plus with repair of retinal detachment via vitrectomy, membrane peel, endolaser panphotocoagulation, and injection of gas-air 75%.  SURGEON:  Alford Highland. Nicolina Hirt, M.D.  ANESTHESIA:  General endotracheal.  INDICATION FOR PROCEDURE:  The patient is a 36 year old woman with previously advanced proliferative diabetic retinopathy of each eye, who is recently status post first initial treatment of panphotocoagulation in the office for advanced nearly 360 degrees neovascularization of the retina with concomitant iris rubeosis signifying advanced disease.  The patient, after panphotocoagulation beginning regression of diabetic retinopathy, developed dense nonclearing vitreous hemorrhage, progression of contracture of the retinal detachment temporally.  This intent to reattach the retina as well as to clear the medial opacification.  The patient understands the risk of anesthesia,  rare occurrence of death, but also to the eye including but not limited to, from this procedure and the underlying condition including but not limited to hemorrhage, infection, scarring, need for another surgery, change in vision, loss of vision, progressive disease, despite intervention.  DESCRIPTION OF PROCEDURE:  After appropriate signed consent was obtained, the patient was taken to the operating room.  In the operating room, appropriate monitors followed by mild sedation.  Appropriate site selection was confirmed.  General anesthesia was achieved without difficulty.  Again appropriate site selection was confirmed with the operating staff and thereafter with a time-out and then thereafter, the right periocular region was sterilely prepped and draped in usual ophthalmic fashion.  The lid speculum was applied.  A 25-gauge trocar was placed in the inferotemporal quadrant.  Superior trocars applied. Core vitrectomy was then begun.  Vitreous opacification was engaged and was removed without difficulty.  No findings of vitreoretinal detachment temporally.  There was edge of the intended retina neovascularization and fibrovascular elevation temporally.  These were removed with 25- gauge membrane techniques.  Localized detachment temporally appeared to be without holes or tears.  Inferiorly, there is a larger break in the vitreous base with a shallow subretinal fluid.  The remainder of the vitreous base was trimmed, care taken not to touch the lens of the eye.  Endolaser photocoagulation was placed peripherally and posteriorly. Fluid-air exchange completed to reattach small amount of  fluid inferiorly and the bed of the shallow detachment retinal break was then treated without difficulty.  There was no residual traction.  For this reason, but I believe it was important to leave some bit of air in the eye to help with visualization but also to maintain retinal reattachment but more  importantly to minimize first hemorrhage in the early postoperative period.  At this time, under air, completion of panphotocoagulation was done.  At this time, a small amount of 25% of the fluid was re-instilled in the vitreous cavity.  Superior trocars were removed from the eye and the infusion was then removed from the eye.  Subconjunctival Decadron was applied.  Sterile patch and Fox shield were applied.  The patient tolerated the procedure without complication and the patient was taken to the PACU without complication.     [name], M.D.     GAR/MEDQ  D:  09/13/2011  T:  09/14/2011  Job:  [id number]

## 2011-10-17 ENCOUNTER — Encounter (HOSPITAL_COMMUNITY): Payer: Self-pay | Admitting: Pharmacy Technician

## 2011-10-25 ENCOUNTER — Other Ambulatory Visit: Payer: Self-pay | Admitting: Ophthalmology

## 2011-10-25 ENCOUNTER — Encounter: Payer: Self-pay | Admitting: Ophthalmology

## 2011-10-26 ENCOUNTER — Encounter (HOSPITAL_COMMUNITY): Payer: Self-pay

## 2011-10-26 MED ORDER — TROPICAMIDE 1 % OP SOLN: 1.0000 [drp] | OPHTHALMIC | Status: DC | PRN

## 2011-10-26 MED ORDER — GATIFLOXACIN 0.5 % OP SOLN
1.0000 [drp] | Freq: Four times a day (QID) | OPHTHALMIC | Status: AC
  Administered 2011-10-27 (×3): 1 [drp] via OPHTHALMIC
  Filled 2011-10-26: qty 2.5

## 2011-10-26 MED ORDER — KETOROLAC TROMETHAMINE 0.5 % OP SOLN: 1.0000 [drp] | OPHTHALMIC | Status: DC

## 2011-10-26 MED ORDER — CYCLOPENTOLATE HCL 1 % OP SOLN
1.0000 [drp] | OPHTHALMIC | Status: AC | PRN
  Administered 2011-10-27 (×3): 1 [drp] via OPHTHALMIC
  Filled 2011-10-26: qty 2

## 2011-10-26 MED ORDER — ATROPINE SULFATE 1 % OP SOLN
1.0000 [drp] | OPHTHALMIC | Status: DC | PRN
  Filled 2011-10-26: qty 2

## 2011-10-26 MED ORDER — PHENYLEPHRINE HCL 2.5 % OP SOLN
1.0000 [drp] | Freq: Once | OPHTHALMIC | Status: AC
  Administered 2011-10-27 (×3): 1 [drp] via OPHTHALMIC
  Filled 2011-10-26: qty 3

## 2011-10-27 ENCOUNTER — Ambulatory Visit (HOSPITAL_COMMUNITY): Payer: 59 | Admitting: Anesthesiology

## 2011-10-27 ENCOUNTER — Encounter (HOSPITAL_COMMUNITY)
Admission: RE | Disposition: A | Discharge status: Home / Self Care | Payer: Self-pay | Source: Ambulatory Visit | Attending: Ophthalmology

## 2011-10-27 ENCOUNTER — Encounter (HOSPITAL_COMMUNITY): Payer: Self-pay | Admitting: Anesthesiology

## 2011-10-27 ENCOUNTER — Ambulatory Visit (HOSPITAL_COMMUNITY)
Admission: RE | Admit: 2011-10-27 | Discharge: 2011-10-27 | Disposition: A | Discharge status: Home / Self Care | Payer: 59 | Source: Ambulatory Visit | Attending: Ophthalmology | Admitting: Ophthalmology

## 2011-10-27 ENCOUNTER — Encounter (HOSPITAL_COMMUNITY): Payer: Self-pay | Admitting: Surgery

## 2011-10-27 DIAGNOSIS — K219 Gastro-esophageal reflux disease without esophagitis: Secondary | ICD-10-CM | POA: Insufficient documentation

## 2011-10-27 DIAGNOSIS — Z794 Long term (current) use of insulin: Secondary | ICD-10-CM | POA: Insufficient documentation

## 2011-10-27 DIAGNOSIS — E11359 Type 2 diabetes mellitus with proliferative diabetic retinopathy without macular edema: Secondary | ICD-10-CM | POA: Insufficient documentation

## 2011-10-27 DIAGNOSIS — I10 Essential (primary) hypertension: Secondary | ICD-10-CM | POA: Insufficient documentation

## 2011-10-27 DIAGNOSIS — H4310 Vitreous hemorrhage, unspecified eye: Secondary | ICD-10-CM | POA: Insufficient documentation

## 2011-10-27 DIAGNOSIS — E1139 Type 2 diabetes mellitus with other diabetic ophthalmic complication: Secondary | ICD-10-CM | POA: Insufficient documentation

## 2011-10-27 HISTORY — DX: Bronchitis, not specified as acute or chronic: J40

## 2011-10-27 HISTORY — DX: Pneumonia, unspecified organism: J18.9

## 2011-10-27 HISTORY — PX: PARS PLANA VITRECTOMY: SHX2166

## 2011-10-27 HISTORY — DX: Other specified postprocedural states: Z98.890

## 2011-10-27 HISTORY — DX: Nausea with vomiting, unspecified: R11.2

## 2011-10-27 LAB — BASIC METABOLIC PANEL
CO2: 24 mEq/L (ref 19–32)
GFR calc non Af Amer: 62 mL/min — ABNORMAL LOW (ref >90)
Glucose, Bld: 80 mg/dL (ref 70–99)
Potassium: 4.1 mEq/L (ref 3.5–5.1)
Sodium: 140 mEq/L (ref 135–145)

## 2011-10-27 LAB — GLUCOSE, CAPILLARY
Glucose-Capillary: 70 mg/dL (ref 70–99)
Glucose-Capillary: 79 mg/dL (ref 70–99)
Glucose-Capillary: 242 mg/dL — ABNORMAL HIGH (ref 70–99)

## 2011-10-27 LAB — SURGICAL PCR SCREEN: MRSA, PCR: NEGATIVE

## 2011-10-27 LAB — HCG, SERUM, QUALITATIVE: Preg, Serum: NEGATIVE

## 2011-10-27 LAB — CBC
Hemoglobin: 13.4 g/dL (ref 12.0–15.0)
MCH: 28.6 pg (ref 26.0–34.0)
MCV: 85.1 fL (ref 78.0–100.0)
RBC: 4.69 MIL/uL (ref 3.87–5.11)

## 2011-10-27 SURGERY — PARS PLANA VITRECTOMY WITH 25 GAUGE
Anesthesia: General | Laterality: Right

## 2011-10-27 SURGERY — PARS PLANA VITRECTOMY WITH 25 GAUGE
Anesthesia: General | Site: Eye | Laterality: Right | Wound class: Clean

## 2011-10-27 MED ORDER — EPINEPHRINE HCL 1 MG/ML IJ SOLN
INTRAMUSCULAR | Status: DC | PRN
  Administered 2011-10-27: .3 mg

## 2011-10-27 MED ORDER — BSS IO SOLN
INTRAOCULAR | Status: DC | PRN
  Administered 2011-10-27: 500 mL via INTRAOCULAR

## 2011-10-27 MED ORDER — STERILE WATER FOR IRRIGATION IR SOLN
Status: DC | PRN
  Administered 2011-10-27: 500 mL

## 2011-10-27 MED ORDER — PROPOFOL 10 MG/ML IV EMUL
INTRAVENOUS | Status: DC | PRN
  Administered 2011-10-27: 200 mg via INTRAVENOUS

## 2011-10-27 MED ORDER — LIDOCAINE HCL (CARDIAC) 20 MG/ML IV SOLN
INTRAVENOUS | Status: DC | PRN
  Administered 2011-10-27: 100 mg via INTRAVENOUS

## 2011-10-27 MED ORDER — BSS PLUS IO SOLN
INTRAOCULAR | Status: DC | PRN
  Administered 2011-10-27: 1 via INTRAOCULAR

## 2011-10-27 MED ORDER — DEXAMETHASONE SODIUM PHOSPHATE 10 MG/ML IJ SOLN
INTRAMUSCULAR | Status: DC | PRN
  Administered 2011-10-27: 10 mg

## 2011-10-27 MED ORDER — SCOPOLAMINE 1 MG/3DAYS TD PT72
1.0000 | MEDICATED_PATCH | TRANSDERMAL | Status: DC
  Administered 2011-10-27: 1.5 mg via TRANSDERMAL

## 2011-10-27 MED ORDER — SODIUM CHLORIDE 0.9 % IV SOLN
INTRAVENOUS | Status: DC
  Administered 2011-10-27: 09:00:00 via INTRAVENOUS

## 2011-10-27 MED ORDER — MORPHINE SULFATE 4 MG/ML IJ SOLN: 0.0500 mg/kg | INTRAMUSCULAR | Status: DC | PRN

## 2011-10-27 MED ORDER — ONDANSETRON HCL 4 MG/2ML IJ SOLN
4.0000 mg | Freq: Once | INTRAMUSCULAR | Status: AC | PRN
  Administered 2011-10-27: 4 mg via INTRAVENOUS

## 2011-10-27 MED ORDER — HYPROMELLOSE (GONIOSCOPIC) 2.5 % OP SOLN
OPHTHALMIC | Status: DC | PRN
  Administered 2011-10-27: 1 [drp] via OPHTHALMIC

## 2011-10-27 MED ORDER — SODIUM CHLORIDE 0.9 % IR SOLN
Status: DC | PRN
  Administered 2011-10-27: 1

## 2011-10-27 MED ORDER — MIDAZOLAM HCL 5 MG/5ML IJ SOLN
INTRAMUSCULAR | Status: DC | PRN
  Administered 2011-10-27 (×2): 1 mg via INTRAVENOUS

## 2011-10-27 MED ORDER — HYDROMORPHONE HCL PF 1 MG/ML IJ SOLN
0.2500 mg | INTRAMUSCULAR | Status: DC | PRN
  Administered 2011-10-27: 0.5 mg via INTRAVENOUS

## 2011-10-27 MED ORDER — MUPIROCIN 2 % EX OINT
TOPICAL_OINTMENT | CUTANEOUS | Status: AC
  Administered 2011-10-27: 1 via NASAL
  Filled 2011-10-27: qty 22

## 2011-10-27 MED ORDER — SODIUM CHLORIDE 0.9 % IV SOLN
INTRAVENOUS | Status: DC | PRN
  Administered 2011-10-27: 09:00:00 via INTRAVENOUS

## 2011-10-27 MED ORDER — NEOSTIGMINE METHYLSULFATE 1 MG/ML IJ SOLN
INTRAMUSCULAR | Status: DC | PRN
  Administered 2011-10-27: 5 mg via INTRAVENOUS

## 2011-10-27 MED ORDER — ONDANSETRON HCL 4 MG/2ML IJ SOLN
INTRAMUSCULAR | Status: DC | PRN
  Administered 2011-10-27 (×2): 4 mg via INTRAVENOUS

## 2011-10-27 MED ORDER — ROCURONIUM BROMIDE 100 MG/10ML IV SOLN
INTRAVENOUS | Status: DC | PRN
  Administered 2011-10-27: 40 mg via INTRAVENOUS

## 2011-10-27 MED ORDER — ONDANSETRON HCL 4 MG/2ML IJ SOLN
INTRAMUSCULAR | Status: AC
  Administered 2011-10-27: 4 mg
  Filled 2011-10-27: qty 2

## 2011-10-27 MED ORDER — ONDANSETRON HCL 4 MG/2ML IJ SOLN
INTRAMUSCULAR | Status: AC
  Filled 2011-10-27: qty 2

## 2011-10-27 MED ORDER — FENTANYL CITRATE 0.05 MG/ML IJ SOLN
INTRAMUSCULAR | Status: DC | PRN
  Administered 2011-10-27 (×2): 25 ug via INTRAVENOUS

## 2011-10-27 MED ORDER — GLYCOPYRROLATE 0.2 MG/ML IJ SOLN
INTRAMUSCULAR | Status: DC | PRN
  Administered 2011-10-27: .8 mg via INTRAVENOUS

## 2011-10-27 SURGICAL SUPPLY — 70 items
ACCESSORY FRAGMATOME (MISCELLANEOUS) IMPLANT
APL SRG 3 HI ABS STRL LF PLS (MISCELLANEOUS)
APPLICATOR COTTON TIP 6IN STRL (MISCELLANEOUS) ×2 IMPLANT
APPLICATOR DR MATTHEWS STRL (MISCELLANEOUS) IMPLANT
CANNULA ANT CHAM MAIN (OPHTHALMIC RELATED) IMPLANT
CANNULA FLEX TIP 25G (CANNULA) IMPLANT
CLOTH BEACON ORANGE TIMEOUT ST (SAFETY) ×2 IMPLANT
CORDS BIPOLAR (ELECTRODE) IMPLANT
DRAPE INCISE 51X51 W/FILM STRL (DRAPES) ×1 IMPLANT
DRAPE OPHTHALMIC 77X100 STRL (CUSTOM PROCEDURE TRAY) ×2 IMPLANT
ERASER HMR WETFIELD 23G BP (MISCELLANEOUS) IMPLANT
FILTER BLUE MILLIPORE (MISCELLANEOUS) IMPLANT
FILTER STRAW FLUID ASPIR (MISCELLANEOUS) IMPLANT
FORCEPS ECKARDT ILM 25G SERR (OPHTHALMIC RELATED) IMPLANT
FORCEPS HORIZONTAL 25G DISP (OPHTHALMIC RELATED) IMPLANT
GAS OPHTHALMIC (MISCELLANEOUS) IMPLANT
GLOVE ECLIPSE 6.5 STRL STRAW (GLOVE) ×2 IMPLANT
GLOVE SS BIOGEL STRL SZ 8.5 (GLOVE) ×1 IMPLANT
GLOVE SUPERSENSE BIOGEL SZ 8.5 (GLOVE) ×1
GOWN EXTRA PROTECTION XL (GOWNS) ×2 IMPLANT
GOWN STRL NON-REIN LRG LVL3 (GOWN DISPOSABLE) ×3 IMPLANT
ILLUMINATOR CHOW PICK 25GA (MISCELLANEOUS) IMPLANT
ILLUMINATOR ENDO 25GA (MISCELLANEOUS) ×2 IMPLANT
KIT BASIN OR (CUSTOM PROCEDURE TRAY) ×2 IMPLANT
KIT PERFLUORON PROCEDURE 5ML (MISCELLANEOUS) IMPLANT
KIT ROOM TURNOVER OR (KITS) ×2 IMPLANT
KNIFE CRESCENT 2.5 55 ANG (BLADE) IMPLANT
LENS BIOM SUPER VIEW SET DISP (OPHTHALMIC RELATED) ×2 IMPLANT
MARKER SKIN DUAL TIP RULER LAB (MISCELLANEOUS) ×2 IMPLANT
MASK EYE SHIELD (GAUZE/BANDAGES/DRESSINGS) ×1 IMPLANT
MICROPICK 25G (MISCELLANEOUS)
NDL 18GX1X1/2 (RX/OR ONLY) (NEEDLE) IMPLANT
NDL 25GX 5/8IN NON SAFETY (NEEDLE) ×1 IMPLANT
NDL FILTER BLUNT 18X1 1/2 (NEEDLE) IMPLANT
NDL HYPO 25GX1X1/2 BEV (NEEDLE) IMPLANT
NDL HYPO 30X.5 LL (NEEDLE) IMPLANT
NEEDLE 18GX1X1/2 (RX/OR ONLY) (NEEDLE) ×2 IMPLANT
NEEDLE 25GX 5/8IN NON SAFETY (NEEDLE) ×2 IMPLANT
NEEDLE FILTER BLUNT 18X 1/2SAF (NEEDLE) ×1
NEEDLE FILTER BLUNT 18X1 1/2 (NEEDLE) ×1 IMPLANT
NEEDLE HYPO 25GX1X1/2 BEV (NEEDLE) IMPLANT
NEEDLE HYPO 30X.5 LL (NEEDLE) IMPLANT
NS IRRIG 1000ML POUR BTL (IV SOLUTION) ×2 IMPLANT
PACK VITRECTOMY CUSTOM (CUSTOM PROCEDURE TRAY) ×2 IMPLANT
PACK VITRECTOMY PIC MCHSVP (PACKS) IMPLANT
PAD ARMBOARD 7.5X6 YLW CONV (MISCELLANEOUS) ×4 IMPLANT
PAD EYE OVAL STERILE LF (GAUZE/BANDAGES/DRESSINGS) ×1 IMPLANT
PAK VITRECTOMY PIK 25 GA (OPHTHALMIC RELATED) ×2 IMPLANT
PENCIL BIPOLAR 25GA STR DISP (OPHTHALMIC RELATED) IMPLANT
PICK MICROPICK 25G (MISCELLANEOUS) IMPLANT
PROBE COHERENT CURVED (MISCELLANEOUS) IMPLANT
PROBE DIRECTIONAL LASER (MISCELLANEOUS) ×1 IMPLANT
ROLLS DENTAL (MISCELLANEOUS) IMPLANT
SCRAPER DIAMOND 25GA (OPHTHALMIC RELATED) IMPLANT
SET FLUID INJECTOR (SET/KITS/TRAYS/PACK) IMPLANT
STOCKINETTE IMPERVIOUS 9X36 MD (GAUZE/BANDAGES/DRESSINGS) ×4 IMPLANT
STOPCOCK 4 WAY LG BORE MALE ST (IV SETS) IMPLANT
SUT ETHILON 10 0 CS140 6 (SUTURE) IMPLANT
SUT ETHILON 8 0 BV130 4 (SUTURE) IMPLANT
SUT MERSILENE 5 0 RD 1 DA (SUTURE) IMPLANT
SUT PROLENE 10 0 CIF 4 DA (SUTURE) IMPLANT
SUT VICRYL 7 0 TG140 8 (SUTURE) IMPLANT
SYR 30ML SLIP (SYRINGE) IMPLANT
SYR 5ML LL (SYRINGE) IMPLANT
SYR TB 1ML LUER SLIP (SYRINGE) ×2 IMPLANT
TAPE SURG TRANSPORE 1 IN (GAUZE/BANDAGES/DRESSINGS) IMPLANT
TAPE SURGICAL TRANSPORE 1 IN (GAUZE/BANDAGES/DRESSINGS) ×1
TOWEL OR 17X24 6PK STRL BLUE (TOWEL DISPOSABLE) ×4 IMPLANT
WATER STERILE IRR 1000ML POUR (IV SOLUTION) ×2 IMPLANT
WIPE INSTRUMENT VISIWIPE 73X73 (MISCELLANEOUS) ×2 IMPLANT

## 2011-10-27 NOTE — Brief Op Note (Signed)
10/27/2011  10:28 AM  PATIENT:  Connie Aguilar  36 y.o. female  PRE-OPERATIVE DIAGNOSIS:  Vitreous Hemorrhage Right Eye, dense non clearing, post vitrectomy and with proliferative diabetic retinopathy  POST-OPERATIVE DIAGNOSIS:  Vitreous Hemorrhage Right Eye,,,,,same  PROCEDURE:  Procedure(s) (LRB): PARS PLANA VITRECTOMY WITH 25 GAUGE (Right) PHOTOCOAGULATION WITH LASER (Right)  SURGEON:  Surgeon(s) and Role:    * Edmon Crape, MD - Primary  PHYSICIAN ASSISTANT:   ASSISTANTS: none   ANESTHESIA:   general  EBL:  Total I/O In: 500 [I.V.:500] Out: -   BLOOD ADMINISTERED:none  DRAINS: none   LOCAL MEDICATIONS USED:  NONE  SPECIMEN:  No Specimen  DISPOSITION OF SPECIMEN:  N/A  COUNTS:  YES  TOURNIQUET:  * No tourniquets in log *  DICTATION: .Other Dictation: Dictation Number [id number]  PLAN OF CARE: Discharge to home after PACU  PATIENT DISPOSITION:  PACU - hemodynamically stable.   Delay start of Pharmacological VTE agent (>24hrs) due to surgical blood loss or risk of bleeding: not applicable, short surgical time

## 2011-10-27 NOTE — Transfer of Care (Signed)
Immediate Anesthesia Transfer of Care Note  Patient: Connie Aguilar  Procedure(s) Performed: Procedure(s) (LRB): PARS PLANA VITRECTOMY WITH 25 GAUGE (Right) PHOTOCOAGULATION WITH LASER (Right)  Patient Location: PACU  Anesthesia Type: General  Level of Consciousness: sedated and patient cooperative  Airway & Oxygen Therapy: Patient Spontanous Breathing and Patient connected to face mask oxygen  Post-op Assessment: Report given to PACU RN, Post -op Vital signs reviewed and stable and Patient moving all extremities X 4  Post vital signs: Reviewed and stable  Complications: No apparent anesthesia complications

## 2011-10-27 NOTE — Progress Notes (Signed)
Pt became nauseated getting OOB to wheelchair.  Will give another dose zofran.

## 2011-10-27 NOTE — Anesthesia Preprocedure Evaluation (Addendum)
Anesthesia Evaluation  Patient identified by MRN, date of birth, ID band Patient awake    Reviewed: Allergy & Precautions, H&P , NPO status , Patient's Chart, lab work & pertinent test results, reviewed documented beta blocker date and time   Airway Mallampati: II TM Distance: >3 FB Neck ROM: Full    Dental  (+) Teeth Intact and Dental Advisory Given   Pulmonary  breath sounds clear to auscultation        Cardiovascular hypertension, Pt. on home beta blockers Rhythm:Regular Rate:Normal     Neuro/Psych    GI/Hepatic GERD-  ,  Endo/Other  Diabetes mellitus-, Well Controlled, Type 1, Insulin DependentMorbid obesity  Renal/GU      Musculoskeletal   Abdominal   Peds  Hematology   Anesthesia Other Findings   Reproductive/Obstetrics                         Anesthesia Physical Anesthesia Plan  ASA: III  Anesthesia Plan: General   Post-op Pain Management:    Induction: Intravenous  Airway Management Planned: Oral ETT  Additional Equipment:   Intra-op Plan:   Post-operative Plan: Extubation in OR  Informed Consent: I have reviewed the patients History and Physical, chart, labs and discussed the procedure including the risks, benefits and alternatives for the proposed anesthesia with the patient or authorized representative who has indicated his/her understanding and acceptance.   Dental advisory given  Plan Discussed with: Anesthesiologist, CRNA and Surgeon  Anesthesia Plan Comments:        Anesthesia Quick Evaluation

## 2011-10-27 NOTE — Anesthesia Postprocedure Evaluation (Signed)
  Anesthesia Post-op Note  Patient: Connie Aguilar  Procedure(s) Performed: Procedure(s) (LRB): PARS PLANA VITRECTOMY WITH 25 GAUGE (Right) PHOTOCOAGULATION WITH LASER (Right)  Patient Location: PACU  Anesthesia Type: General  Level of Consciousness: awake, alert  and oriented  Airway and Oxygen Therapy: Patient Spontanous Breathing and Patient connected to nasal cannula oxygen  Post-op Pain: moderate  Post-op Assessment: Post-op Vital signs reviewed  Post-op Vital Signs: Reviewed  Complications: No apparent anesthesia complications

## 2011-10-27 NOTE — Progress Notes (Signed)
Surgical Site(Right Eye) verified with Dr. Luciana Axe along with correct drops.

## 2011-10-27 NOTE — Progress Notes (Signed)
Pt's CBG-70.  Pt denies any s/sx of Hypoglycemia.  OR notified and stated that they will be here at 0800 to get pt for procedure.  Pt notified of this & instructed her to let this writer know if she feel any s/sx.  Pt verbalized understanding.  If OR not here by 0800, will do another CBG.//L. Grayson Pfefferle,RN

## 2011-10-27 NOTE — H&P (Signed)
Connie Aguilar is an 36 y.o. female.   Chief Complaint: CLOUDY VISION PERSISTENT, RIGHT EYE, FROM VITREOUS HEMORRHAGE HPI: DENSE NONCLEARING VITREOUS HEMORRHAGE RIGHT EYE POST OP VITRECTOMY AND LASER RIGHT EYE  Past Medical History  Diagnosis Date  . Diabetes mellitus   . GERD (gastroesophageal reflux disease)   . Hypertension   . Pneumonia     hx of as child  . Bronchitis     hx of as child  . PONV (postoperative nausea and vomiting)     Past Surgical History  Procedure Date  . Wisdom tooth extraction   . Eye surgery march 2013, laser prp  OU  . Pars plana vitrectomy 09/13/2011    Procedure: PARS PLANA VITRECTOMY WITH 25 GAUGE;  Surgeon: Edmon Crape, MD;  Location: [facility] OR;  Service: Ophthalmology;  Laterality: Right;    Family History  Problem Relation Age of Onset  . Cancer Mother   . Hypertension Mother   . Diabetes Mother   . Diabetes Father   . Diabetes Sister   . Anesthesia problems Neg Hx   . Hypotension Neg Hx   . Malignant hyperthermia Neg Hx   . Pseudochol deficiency Neg Hx    Social History:  reports that she quit smoking about 9 months ago. Her smoking use included Cigarettes. She has a 7.5 pack-year smoking history. She does not have any smokeless tobacco history on file. She reports that she does not drink alcohol or use illicit drugs.  Allergies:  Allergies  Allergen Reactions  . Penicillins Rash    Medications Prior to Admission  Medication Sig Dispense Refill  . insulin lispro protamine-insulin lispro (HUMALOG 50/50) (50-50) 100 UNIT/ML SUSP Inject 20 Units into the skin at bedtime.      . insulin lispro protamine-insulin lispro (HUMALOG 75/25) (75-25) 100 UNIT/ML SUSP Inject 36 Units into the skin daily with breakfast.      . losartan (COZAAR) 50 MG tablet Take 50 mg by mouth daily.      . metoprolol succinate (TOPROL-XL) 50 MG 24 hr tablet Take 50 mg by mouth daily. Take with or immediately following a meal.      . tobramycin (TOBREX) 0.3 % ophthalmic  solution Place 1 drop into the right eye every 6 (six) hours.      . Vitamin D, Ergocalciferol, (DRISDOL) 50000 UNITS CAPS Take 50,000 Units by mouth 2 (two) times a week. Take on mondays and fridays.        Results for orders placed during the hospital encounter of 10/27/11 (from the past 48 hour(s))  GLUCOSE, CAPILLARY     Status: Normal   Collection Time   10/27/11  6:37 AM      Component Value Range Comment   Glucose-Capillary 70  70 - 99 (mg/dL)   BASIC METABOLIC PANEL     Status: Abnormal   Collection Time   10/27/11  6:41 AM      Component Value Range Comment   Sodium 140  135 - 145 (mEq/L)    Potassium 4.1  3.5 - 5.1 (mEq/L)    Chloride 108  96 - 112 (mEq/L)    CO2 24  19 - 32 (mEq/L)    Glucose, Bld 80  70 - 99 (mg/dL)    BUN 15  6 - 23 (mg/dL)    Creatinine, Ser 1.61 (*) 0.50 - 1.10 (mg/dL)    Calcium 9.1  8.4 - 10.5 (mg/dL)    GFR calc non Af Amer 62 (*) >90 (  mL/min)    GFR calc Af Amer 72 (*) >90 (mL/min)   CBC     Status: Normal   Collection Time   10/27/11  6:41 AM      Component Value Range Comment   WBC 5.9  4.0 - 10.5 (K/uL)    RBC 4.69  3.87 - 5.11 (MIL/uL)    Hemoglobin 13.4  12.0 - 15.0 (g/dL)    HCT 16.1  09.6 - 04.5 (%)    MCV 85.1  78.0 - 100.0 (fL)    MCH 28.6  26.0 - 34.0 (pg)    MCHC 33.6  30.0 - 36.0 (g/dL)    RDW 40.9  81.1 - 91.4 (%)    Platelets 272  150 - 400 (K/uL)   SURGICAL PCR SCREEN     Status: Normal   Collection Time   10/27/11  6:50 AM      Component Value Range Comment   MRSA, PCR NEGATIVE  NEGATIVE     Staphylococcus aureus NEGATIVE  NEGATIVE    HCG, SERUM, QUALITATIVE     Status: Normal   Collection Time   10/27/11  6:50 AM      Component Value Range Comment   Preg, Serum NEGATIVE  NEGATIVE    GLUCOSE, CAPILLARY     Status: Normal   Collection Time   10/27/11  8:36 AM      Component Value Range Comment   Glucose-Capillary 79  70 - 99 (mg/dL)    No results found.  Review of Systems  Constitutional: Negative.   Eyes: Positive  for blurred vision.  Cardiovascular: Negative for palpitations.  All other systems reviewed and are negative.    Blood pressure 146/86, pulse 76, temperature 98.2 F (36.8 C), temperature source Oral, resp. rate 18, height 5\' 1"  (1.549 m), weight 97.523 kg (215 lb), last menstrual period 10/14/2011, SpO2 99.00%. Physical Exam  Constitutional: She is oriented to person, place, and time. Vital signs are normal. She appears well-developed and well-nourished.  HENT:  Head: Normocephalic and atraumatic.  Eyes: Lids are normal.         DENSE NONCLEARING VITREOUS HEMORRHAGE  Neck: Trachea normal and normal range of motion. Neck supple.  Cardiovascular: Normal rate, regular rhythm and normal heart sounds.   Respiratory: Effort normal and breath sounds normal.  GI: Soft. Normal appearance.  Musculoskeletal: Normal range of motion.  Neurological: She is alert and oriented to person, place, and time. She has normal reflexes.  Skin: Skin is warm and dry.  Psychiatric: Her behavior is normal. Judgment and thought content normal. Her mood appears anxious.     Assessment/Plan DENSE NONCLEARING VITREOUS HEMORRHAGE RIGHT EYE, POST VITRECTOMY FOR PROLIFERATIVE DIABETIC RETINOPATHY .  PLAN IS FOR REPEAT VITRECTOMY AND ENDOLASER PHOTOCOAGULATION UNDER GENERAL ANESTHESIA, AS REQUESTED BY PATIENT  [name] A 10/27/2011, 9:27 AM

## 2011-10-27 NOTE — Anesthesia Procedure Notes (Signed)
Procedure Name: Intubation Date/Time: 10/27/2011 9:40 AM Performed by: Sherie Don Pre-anesthesia Checklist: Patient identified, Emergency Drugs available, Suction available, Patient being monitored and Timeout performed Patient Re-evaluated:Patient Re-evaluated prior to inductionOxygen Delivery Method: Circle system utilized Preoxygenation: Pre-oxygenation with 100% oxygen Intubation Type: IV induction Ventilation: Mask ventilation without difficulty Laryngoscope Size: Mac Grade View: Grade I Tube type: Oral Number of attempts: 1 Airway Equipment and Method: Stylet Placement Confirmation: ETT inserted through vocal cords under direct vision,  positive ETCO2 and breath sounds checked- equal and bilateral Secured at: 23 cm Tube secured with: Tape Dental Injury: Teeth and Oropharynx as per pre-operative assessment

## 2011-10-27 NOTE — Preoperative (Signed)
Beta Blockers   Reason not to administer Beta Blockers: took Toprol XL this am 10/27/11

## 2011-10-28 NOTE — Op Note (Signed)
NAMEKATURAH, KARAPETIAN                   ACCOUNT NO.:  [id number]  MEDICAL RECORD NO.:  [id number]  LOCATION:  [facility]                         FACILITY:  [facility]  PHYSICIAN:  Jillyn Hidden A. Eliasar Hlavaty, M.D.   DATE OF BIRTH:  1976/02/13  DATE OF PROCEDURE:  10/27/2011 DATE OF DISCHARGE:                              OPERATIVE REPORT   PREOPERATIVE DIAGNOSIS:  Dense nonclearing vitreous hemorrhage, right eye, from proliferative diabetic retinopathy, status post prior vitrectomy right eye.  POSTOPERATIVE DIAGNOSIS:  Dense nonclearing vitreous hemorrhage, right eye, from proliferative diabetic retinopathy, status post prior vitrectomy right eye.  PROCEDURE NOTE:  Post pars plana vitrectomy, 25-gauge plus with endolaser photocoagulation, right eye.  SURGEON:  Alford Highland. Melanni Benway, M.D.  ANESTHESIA:  General anesthesia per patient's request.  INDICATION FOR PROCEDURE:  The patient is a 36 year old woman with advanced proliferative diabetic retinopathy, retinal mild perfusion, status post complex vitrectomy and repair of tractional changes, tractional detachment, and dense vitreous hemorrhage, proliferative diabetic retinopathy, right eye some 2 months prior.  The patient had this an attempt to clear the residual dense vitreous hemorrhage.  She has requirement to return to activities of daily living including employment.  She understands the risks of anesthesia including recurrence, death  loss of the eye including but not limited to hemorrhage, infection, scarring, need for another surgery, change in vision, loss of vision, progressive disease despite intervention. __________ particular case, the likelihood progression of cataract surgery is very high.  She understands the risk and wishes to proceed.  DESCRIPTION OF PROCEDURE:  After appropriate signed consent was obtained, she was taken to the operating room.  In the operating room, appropriate monitors followed by mild sedation and then general anesthesia.   Surgical time-out was carried out.  The entire operating staff and the operative site was the right eye.  Thereafter, the right periocular region sterilely prepped and draped in the usual ophthalmic fashion.  Lid speculum applied.  The 25-gauge trocar placed in the inferotemporal quadrant trocar, superior trocar was applied.  Core vitrectomy had been previously done.  Vitreous base inspissated vitreous __________ was noted inferiorly.  Scleral pressure was then used to trim the vitreous base with care to avoid contact with posterior lens surface.  Scleral depression was then used in the inferior one half of the retina along the vitreous base.  Visual axis was cleared. Significant dark vitreous hemorrhage was aspirated from the eye without difficulty immediately.  There were no retinal detachments.  There was notable finding of adherent anterior hyaloid on the nasal aspect of the posterior lens surface which was not resectable but should clear when dissolve over time.  All of its detachments peripherally seemed to be amputated.  With the endolaser photocoagulation placed again panretinal and a fill- in pattern.  The insertion device removed from the eye.  Hemostasis was spontaneous.  The superior trocar was removed from the eye. The infusion was then removed.  Subconjunctival Decadron applied.  Sterile patch and Fox shield was applied  Patient tolerated the procedure without complication, taken to the PACU in good and stable condition.     Alford Highland Janari Gagner, M.D.     GAR/MEDQ  D:  10/27/2011  T:  10/27/2011  Job:  [id number]

## 2011-10-30 ENCOUNTER — Encounter (HOSPITAL_COMMUNITY): Payer: Self-pay | Admitting: Ophthalmology

## 2011-11-20 ENCOUNTER — Encounter (HOSPITAL_COMMUNITY): Payer: Self-pay | Admitting: Pharmacy Technician

## 2011-11-20 ENCOUNTER — Encounter: Payer: Self-pay | Admitting: Pharmacy Technician

## 2011-11-23 NOTE — Patient Instructions (Addendum)
Your procedure is scheduled on: 11/27/2011  Report to [facility] at  730      AM.  Call this number if you have problems the morning of surgery: [phone]   Do not eat food or drink liquids :After Midnight.      Take these medicines the morning of surgery with A SIP OF WATER:losartan,metoprolol. Take 1/2 of humalog 50/50..If take your insulin at night. No insulin the morning of surgery if you take it in the morning.    Do not wear jewelry, make-up or nail polish.  Do not wear lotions, powders, or perfumes. You may wear deodorant.  Do not shave 48 hours prior to surgery.  Do not bring valuables to the hospital.  Contacts, dentures or bridgework may not be worn into surgery.  Leave suitcase in the car. After surgery it may be brought to your room.  For patients admitted to the hospital, checkout time is 11:00 AM the [place] of discharge.   Patients discharged the [place] of surgery will not be allowed to drive home.  :     Please read over the following fact sheets that you were given: Coughing and Deep Breathing, Surgical Site Infection Prevention, Anesthesia Post-op Instructions and Care and Recovery After Surgery    Cataract A cataract is a clouding of the lens of the eye. When a lens becomes cloudy, vision is reduced based on the degree and nature of the clouding. Many cataracts reduce vision to some degree. Some cataracts make people more near-sighted as they develop. Other cataracts increase glare. Cataracts that are ignored and become worse can sometimes look white. The white color can be seen through the pupil. CAUSES   Aging. However, cataracts may occur at any age, even in newborns.   Certain drugs.   Trauma to the eye.   Certain diseases such as diabetes.   Specific eye diseases such as chronic inflammation inside the eye or a sudden attack of a rare form of glaucoma.   Inherited or acquired medical problems.  SYMPTOMS   Gradual, progressive drop in vision in the affected eye.    Severe, rapid visual loss. This most often happens when trauma is the cause.  DIAGNOSIS  To detect a cataract, an eye doctor examines the lens. Cataracts are best diagnosed with an exam of the eyes with the pupils enlarged (dilated) by drops.  TREATMENT  For an early cataract, vision may improve by using different eyeglasses or stronger lighting. If that does not help your vision, surgery is the only effective treatment. A cataract needs to be surgically removed when vision loss interferes with your everyday activities, such as driving, reading, or watching TV. A cataract may also have to be removed if it prevents examination or treatment of another eye problem. Surgery removes the cloudy lens and usually replaces it with a substitute lens (intraocular lens, IOL).  At a time when both you and your doctor agree, the cataract will be surgically removed. If you have cataracts in both eyes, only one is usually removed at a time. This allows the operated eye to heal and be out of danger from any possible problems after surgery (such as infection or poor wound healing). In rare cases, a cataract may be doing damage to your eye. In these cases, your caregiver may advise surgical removal right away. The vast majority of people who have cataract surgery have better vision afterward. HOME CARE INSTRUCTIONS  If you are not planning surgery, you may be asked  to do the following:  Use different eyeglasses.   Use stronger or brighter lighting.   Ask your eye doctor about reducing your medicine dose or changing medicines if it is thought that a medicine caused your cataract. Changing medicines does not make the cataract go away on its own.   Become familiar with your surroundings. Poor vision can lead to injury. Avoid bumping into things on the affected side. You are at a higher risk for tripping or falling.   Exercise extreme care when driving or operating machinery.   Wear sunglasses if you are sensitive  to bright light or experiencing problems with glare.  SEEK IMMEDIATE MEDICAL CARE IF:   You have a worsening or sudden vision loss.   You notice redness, swelling, or increasing pain in the eye.   You have a fever.  Document Released: 06/05/2005 Document Revised: 05/25/2011 Document Reviewed: 01/27/2011 [facility] Patient Information 2012 [place], [place].PATIENT INSTRUCTIONS POST-ANESTHESIA  IMMEDIATELY FOLLOWING SURGERY:  Do not drive or operate machinery for the first twenty four hours after surgery.  Do not make any important decisions for twenty four hours after surgery or while taking narcotic pain medications or sedatives.  If you develop intractable nausea and vomiting or a severe headache please notify your doctor immediately.  FOLLOW-UP:  Please make an appointment with your surgeon as instructed. You do not need to follow up with anesthesia unless specifically instructed to do so.  WOUND CARE INSTRUCTIONS (if applicable):  Keep a dry clean dressing on the anesthesia/puncture wound site if there is drainage.  Once the wound has quit draining you may leave it open to air.  Generally you should leave the bandage intact for twenty four hours unless there is drainage.  If the epidural site drains for more than 36-48 hours please call the anesthesia department.  QUESTIONS?:  Please feel free to call your physician or the hospital operator if you have any questions, and they will be happy to assist you.     [facility] Anesthesia Department [address] [place] [phone]

## 2011-11-24 ENCOUNTER — Encounter (HOSPITAL_COMMUNITY): Payer: Self-pay

## 2011-11-24 ENCOUNTER — Encounter (HOSPITAL_COMMUNITY)
Admission: RE | Admit: 2011-11-24 | Discharge: 2011-11-24 | Disposition: A | Discharge status: Home / Self Care | Payer: 59 | Source: Ambulatory Visit | Attending: Ophthalmology | Admitting: Ophthalmology

## 2011-11-24 LAB — PREGNANCY, URINE: Preg Test, Ur: NEGATIVE

## 2011-11-24 MED ORDER — NEOMYCIN-POLYMYXIN-DEXAMETH 3.5-10000-0.1 OP OINT
TOPICAL_OINTMENT | OPHTHALMIC | Status: AC
  Filled 2011-11-24: qty 3.5

## 2011-11-24 MED ORDER — LIDOCAINE HCL 3.5 % OP GEL
OPHTHALMIC | Status: AC
  Filled 2011-11-24: qty 5

## 2011-11-24 MED ORDER — PHENYLEPHRINE HCL 2.5 % OP SOLN
OPHTHALMIC | Status: AC
  Filled 2011-11-24: qty 2

## 2011-11-24 MED ORDER — TETRACAINE HCL 0.5 % OP SOLN
OPHTHALMIC | Status: AC
  Filled 2011-11-24: qty 2

## 2011-11-24 MED ORDER — CYCLOPENTOLATE-PHENYLEPHRINE 0.2-1 % OP SOLN
OPHTHALMIC | Status: AC
  Filled 2011-11-24: qty 2

## 2011-11-27 ENCOUNTER — Ambulatory Visit (HOSPITAL_COMMUNITY): Payer: 59 | Admitting: Anesthesiology

## 2011-11-27 ENCOUNTER — Encounter (HOSPITAL_COMMUNITY)
Admission: RE | Disposition: A | Discharge status: Home / Self Care | Payer: Self-pay | Source: Ambulatory Visit | Attending: Ophthalmology

## 2011-11-27 ENCOUNTER — Ambulatory Visit (HOSPITAL_COMMUNITY)
Admission: RE | Admit: 2011-11-27 | Discharge: 2011-11-27 | Disposition: A | Discharge status: Home / Self Care | Payer: 59 | Source: Ambulatory Visit | Attending: Ophthalmology | Admitting: Ophthalmology

## 2011-11-27 ENCOUNTER — Encounter (HOSPITAL_COMMUNITY): Payer: Self-pay | Admitting: *Deleted

## 2011-11-27 ENCOUNTER — Encounter (HOSPITAL_COMMUNITY): Payer: Self-pay | Admitting: Anesthesiology

## 2011-11-27 DIAGNOSIS — I10 Essential (primary) hypertension: Secondary | ICD-10-CM | POA: Insufficient documentation

## 2011-11-27 DIAGNOSIS — Z794 Long term (current) use of insulin: Secondary | ICD-10-CM | POA: Insufficient documentation

## 2011-11-27 DIAGNOSIS — H4310 Vitreous hemorrhage, unspecified eye: Secondary | ICD-10-CM | POA: Insufficient documentation

## 2011-11-27 DIAGNOSIS — Z01812 Encounter for preprocedural laboratory examination: Secondary | ICD-10-CM | POA: Insufficient documentation

## 2011-11-27 DIAGNOSIS — Z79899 Other long term (current) drug therapy: Secondary | ICD-10-CM | POA: Insufficient documentation

## 2011-11-27 DIAGNOSIS — H2510 Age-related nuclear cataract, unspecified eye: Secondary | ICD-10-CM | POA: Insufficient documentation

## 2011-11-27 DIAGNOSIS — E119 Type 2 diabetes mellitus without complications: Secondary | ICD-10-CM | POA: Insufficient documentation

## 2011-11-27 HISTORY — PX: CATARACT EXTRACTION W/PHACO: SHX586

## 2011-11-27 SURGERY — PHACOEMULSIFICATION, CATARACT, WITH IOL INSERTION
Anesthesia: Monitor Anesthesia Care | Site: Eye | Laterality: Left | Wound class: Clean

## 2011-11-27 MED ORDER — ONDANSETRON HCL 4 MG/2ML IJ SOLN
INTRAMUSCULAR | Status: AC
  Administered 2011-11-27: 4 mg via INTRAVENOUS
  Filled 2011-11-27: qty 2

## 2011-11-27 MED ORDER — LACTATED RINGERS IV SOLN
INTRAVENOUS | Status: DC
  Administered 2011-11-27: 1000 mL via INTRAVENOUS

## 2011-11-27 MED ORDER — LIDOCAINE 3.5 % OP GEL OPTIME - NO CHARGE
OPHTHALMIC | Status: DC | PRN
  Administered 2011-11-27: 1 [drp] via OPHTHALMIC

## 2011-11-27 MED ORDER — POVIDONE-IODINE 5 % OP SOLN
OPHTHALMIC | Status: DC | PRN
  Administered 2011-11-27: 1 via OPHTHALMIC

## 2011-11-27 MED ORDER — EPINEPHRINE HCL 1 MG/ML IJ SOLN
INTRAMUSCULAR | Status: AC
  Filled 2011-11-27: qty 1

## 2011-11-27 MED ORDER — LIDOCAINE HCL (PF) 1 % IJ SOLN
INTRAOCULAR | Status: DC | PRN
  Administered 2011-11-27: 09:00:00 via OPHTHALMIC

## 2011-11-27 MED ORDER — ONDANSETRON HCL 4 MG/2ML IJ SOLN: 4.0000 mg | Freq: Once | INTRAMUSCULAR | Status: DC | PRN

## 2011-11-27 MED ORDER — TETRACAINE HCL 0.5 % OP SOLN
1.0000 [drp] | OPHTHALMIC | Status: AC
  Administered 2011-11-27 (×3): 1 [drp] via OPHTHALMIC

## 2011-11-27 MED ORDER — NEOMYCIN-POLYMYXIN-DEXAMETH 0.1 % OP OINT
TOPICAL_OINTMENT | OPHTHALMIC | Status: DC | PRN
  Administered 2011-11-27: 1 via OPHTHALMIC

## 2011-11-27 MED ORDER — CYCLOPENTOLATE-PHENYLEPHRINE 0.2-1 % OP SOLN
1.0000 [drp] | OPHTHALMIC | Status: AC
  Administered 2011-11-27 (×3): 1 [drp] via OPHTHALMIC

## 2011-11-27 MED ORDER — BSS IO SOLN
INTRAOCULAR | Status: DC | PRN
  Administered 2011-11-27: 15 mL via INTRAOCULAR

## 2011-11-27 MED ORDER — PROVISC 10 MG/ML IO SOLN
INTRAOCULAR | Status: DC | PRN
  Administered 2011-11-27: 8.5 mg via INTRAOCULAR

## 2011-11-27 MED ORDER — ONDANSETRON HCL 4 MG/2ML IJ SOLN
4.0000 mg | Freq: Once | INTRAMUSCULAR | Status: AC
  Administered 2011-11-27: 4 mg via INTRAVENOUS

## 2011-11-27 MED ORDER — LIDOCAINE HCL 3.5 % OP GEL
1.0000 "application " | Freq: Once | OPHTHALMIC | Status: AC
  Administered 2011-11-27: 1 via OPHTHALMIC

## 2011-11-27 MED ORDER — MIDAZOLAM HCL 2 MG/2ML IJ SOLN
1.0000 mg | INTRAMUSCULAR | Status: DC | PRN
  Administered 2011-11-27: 2 mg via INTRAVENOUS

## 2011-11-27 MED ORDER — MIDAZOLAM HCL 2 MG/2ML IJ SOLN
INTRAMUSCULAR | Status: AC
  Filled 2011-11-27: qty 2

## 2011-11-27 MED ORDER — PHENYLEPHRINE HCL 2.5 % OP SOLN
1.0000 [drp] | OPHTHALMIC | Status: AC
  Administered 2011-11-27 (×3): 1 [drp] via OPHTHALMIC

## 2011-11-27 MED ORDER — FENTANYL CITRATE 0.05 MG/ML IJ SOLN: 25.0000 ug | INTRAMUSCULAR | Status: DC | PRN

## 2011-11-27 MED ORDER — FENTANYL CITRATE 0.05 MG/ML IJ SOLN
INTRAMUSCULAR | Status: AC
  Filled 2011-11-27: qty 2

## 2011-11-27 MED ORDER — MIDAZOLAM HCL 2 MG/2ML IJ SOLN
INTRAMUSCULAR | Status: AC
  Administered 2011-11-27: 2 mg via INTRAVENOUS
  Filled 2011-11-27: qty 2

## 2011-11-27 MED ORDER — EPINEPHRINE HCL 1 MG/ML IJ SOLN
INTRAOCULAR | Status: DC | PRN
  Administered 2011-11-27: 09:00:00

## 2011-11-27 MED ORDER — MIDAZOLAM HCL 5 MG/5ML IJ SOLN
INTRAMUSCULAR | Status: DC | PRN
  Administered 2011-11-27: 2 mg via INTRAVENOUS

## 2011-11-27 MED ORDER — FENTANYL CITRATE 0.05 MG/ML IJ SOLN
INTRAMUSCULAR | Status: DC | PRN
  Administered 2011-11-27: 25 ug via INTRAVENOUS

## 2011-11-27 SURGICAL SUPPLY — 32 items
CAPSULAR TENSION RING-AMO (OPHTHALMIC RELATED) IMPLANT
CLOTH BEACON ORANGE TIMEOUT ST (SAFETY) ×1 IMPLANT
EYE SHIELD UNIVERSAL CLEAR (GAUZE/BANDAGES/DRESSINGS) ×1 IMPLANT
GLOVE BIO SURGEON STRL SZ 6.5 (GLOVE) IMPLANT
GLOVE BIOGEL PI IND STRL 6.5 (GLOVE) IMPLANT
GLOVE BIOGEL PI IND STRL 7.0 (GLOVE) IMPLANT
GLOVE BIOGEL PI IND STRL 7.5 (GLOVE) IMPLANT
GLOVE BIOGEL PI INDICATOR 6.5 (GLOVE) ×1
GLOVE BIOGEL PI INDICATOR 7.0 (GLOVE)
GLOVE BIOGEL PI INDICATOR 7.5 (GLOVE)
GLOVE ECLIPSE 6.5 STRL STRAW (GLOVE) IMPLANT
GLOVE ECLIPSE 7.0 STRL STRAW (GLOVE) IMPLANT
GLOVE ECLIPSE 7.5 STRL STRAW (GLOVE) IMPLANT
GLOVE EXAM NITRILE LRG STRL (GLOVE) IMPLANT
GLOVE EXAM NITRILE MD LF STRL (GLOVE) ×1 IMPLANT
GLOVE SKINSENSE NS SZ6.5 (GLOVE)
GLOVE SKINSENSE NS SZ7.0 (GLOVE)
GLOVE SKINSENSE STRL SZ6.5 (GLOVE) IMPLANT
GLOVE SKINSENSE STRL SZ7.0 (GLOVE) IMPLANT
KIT VITRECTOMY (OPHTHALMIC RELATED) IMPLANT
PAD ARMBOARD 7.5X6 YLW CONV (MISCELLANEOUS) ×1 IMPLANT
PROC W NO LENS (INTRAOCULAR LENS)
PROC W SPEC LENS (INTRAOCULAR LENS)
PROCESS W NO LENS (INTRAOCULAR LENS) IMPLANT
PROCESS W SPEC LENS (INTRAOCULAR LENS) IMPLANT
RING MALYGIN (MISCELLANEOUS) IMPLANT
SIGHTPATH CAT PROC W REG LENS (Ophthalmic Related) ×3 IMPLANT
SYR TB 1ML LL NO SAFETY (SYRINGE) ×1 IMPLANT
TAPE SURG TRANSPORE 1 IN (GAUZE/BANDAGES/DRESSINGS) IMPLANT
TAPE SURGICAL TRANSPORE 1 IN (GAUZE/BANDAGES/DRESSINGS) ×1
VISCOELASTIC ADDITIONAL (OPHTHALMIC RELATED) ×1 IMPLANT
WATER STERILE IRR 250ML POUR (IV SOLUTION) ×1 IMPLANT

## 2011-11-27 NOTE — Anesthesia Procedure Notes (Signed)
Procedure Name: MAC Date/Time: 11/27/2011 8:53 AM Performed by: Franco Nones Pre-anesthesia Checklist: Patient identified, Emergency Drugs available, Suction available, Timeout performed and Patient being monitored Patient Re-evaluated:Patient Re-evaluated prior to inductionOxygen Delivery Method: Nasal Cannula

## 2011-11-27 NOTE — Anesthesia Preprocedure Evaluation (Addendum)
Anesthesia Evaluation  Patient identified by MRN, date of birth, ID band Patient awake    Reviewed: Allergy & Precautions, H&P , NPO status , Patient's Chart, lab work & pertinent test results, reviewed documented beta blocker date and time   History of Anesthesia Complications (+) PONV  Airway Mallampati: II TM Distance: >3 FB Neck ROM: Full    Dental  (+) Teeth Intact and Dental Advisory Given   Pulmonary  breath sounds clear to auscultation        Cardiovascular hypertension, Pt. on home beta blockers Rhythm:Regular Rate:Normal     Neuro/Psych    GI/Hepatic GERD-  ,  Endo/Other  Diabetes mellitus-, Well Controlled, Type 1, Insulin DependentMorbid obesity  Renal/GU      Musculoskeletal   Abdominal   Peds  Hematology   Anesthesia Other Findings   Reproductive/Obstetrics                           Anesthesia Physical Anesthesia Plan  ASA: III  Anesthesia Plan: MAC   Post-op Pain Management:    Induction: Intravenous  Airway Management Planned: Nasal Cannula  Additional Equipment:   Intra-op Plan:   Post-operative Plan:   Informed Consent: I have reviewed the patients History and Physical, chart, labs and discussed the procedure including the risks, benefits and alternatives for the proposed anesthesia with the patient or authorized representative who has indicated his/her understanding and acceptance.     Plan Discussed with:   Anesthesia Plan Comments:         Anesthesia Quick Evaluation

## 2011-11-27 NOTE — Brief Op Note (Signed)
Pre-Op Dx: Cataract OD Post-Op Dx: Cataract OD Surgeon: Gemma Payor Anesthesia: Topical with MAC Surgery: Cataract Extraction with Intraocular lens Implant OD Implant: Lenstec, Model Softec HD Blood Loss: None Specimen: None Complications: Damaged IOL explanted.

## 2011-11-27 NOTE — H&P (Signed)
I have reviewed the H&P, the patient was re-examined, and I have identified no interval changes in medical condition and plan of care since the history and physical of record  

## 2011-11-27 NOTE — Transfer of Care (Signed)
Immediate Anesthesia Transfer of Care Note  Patient: Connie Aguilar  Procedure(s) Performed: Procedure(s) (LRB): CATARACT EXTRACTION PHACO AND INTRAOCULAR LENS PLACEMENT (IOC) (Left)  Patient Location: Shortstay  Anesthesia Type: MAC  Level of Consciousness: awake  Airway & Oxygen Therapy: Patient Spontanous Breathing   Post-op Assessment: Report given to PACU RN, Post -op Vital signs reviewed and stable and Patient moving all extremities  Post vital signs: Reviewed and stable  Complications: No apparent anesthesia complications

## 2011-11-27 NOTE — Discharge Instructions (Signed)
Connie Aguilar  11/27/2011     Instructions  1. Use medications as Instructed.  Shake well before use. Wait 5 minutes between drops.  {OPHTHALMIC ANTIBIOTICS:22167} 4 times a Soward x 1 week.  {OPHTHALMIC ANTI-INFLAMMATORY:22168} 2 times a Neyens x 4 weeks.  {OPHTHALMIC STEROID:22169} 4 times a Difrancesco - week 1   3 times a Fentress - Week 2, 2 times a Lipke- Week 3, 1 time a Villa - Week 4.  2. Do not rub the operative eye. Do not swim underwater for 2 weeks.  3. You may remove the clear shield and resume your normal activities the [place] after  Surgery. Your eyes may feel more comfortable if you wear dark glasses outside.  4. Call our office at [phone] if you have sudden change in vision, extreme redness or pain. Some fluctuation in vision is normal after surgery. If you have an emergency after hours, call Dr. Alto Denver at [phone].  5. It is important that you attend all of your follow-up appointments.        Follow-up:{follow up:32580} with Gemma Payor, MD.   Dr. Lahoma Crocker: [phone]  Dr. Lita Mains: [phone]  Dr. Alto Denver: [phone]   If you find that you cannot contact your physician, but feel that your signs and   Symptoms warrant a physician's attention, call the Emergency Room at   [phone].   Other{NA AND UUVOZDGU:44034}.

## 2011-11-27 NOTE — Op Note (Signed)
[name], Connie Aguilar                   ACCOUNT NO.:  [id number]  MEDICAL RECORD NO.:  [id number]  LOCATION:  [facility]                          FACILITY:  [facility]  PHYSICIAN:  Susanne Greenhouse, MD       DATE OF BIRTH:  [birth year]  DATE OF PROCEDURE:  11/27/2011 DATE OF DISCHARGE:  11/27/2011                              OPERATIVE REPORT   PREOPERATIVE DIAGNOSES: 1. Nuclear cataract, left eye, diagnosis code 366.16. 2. Vitreous hemorrhage, left eye.  POSTOPERATIVE DIAGNOSES: 1. Nuclear cataract, left eye, diagnosis code 366.16. 2. Vitreous hemorrhage, left eye.  ANESTHESIA:  Topical with IV sedation and monitored anesthesia care.  INDICATION FOR SURGERY:  The patient is a diabetic with a vitreous hemorrhage in the left eye and early nuclear sclerosis.  Dr. Luciana Axe, who is the retina specialist requested cataract extraction prior to surgery to correct vitreous hemorrhage.  DESCRIPTION OF THE OPERATION:  In the preoperative holding area, dilating drops and viscous lidocaine were placed into the left eye.  The patient was then brought to the operating room where she was prepped and draped.  Starting dilated pupil was approximately 4 mm.  A 15 blade was then used to make a clear corneal incision that is temporal limbus.  The anterior chamber was then filled with a 1% nonpreserved lidocaine solution with epinephrine.  This was followed by filling the anterior chamber with Provisc.  A 2.4 mm Keratome blade was then used to make a clear corneal incision at the temporal limbus.  Red reflex was very poor due to the vitreous hemorrhage and poor dilation.  Prior to starting the capsulotomy, dilation was approximately 6 mm.  A continuous tear capsulotomy was performed with a bent cystotome needle and Utrata forceps.  At the last quadrant, the capsulotomy extended to the periphery and was not able to be recovered, so the bent cystotome needle was used to initiate the capsulotomy in the other direction so  a complete capsulotomy was completed, but there was a probable radial tear to the equator inferiorly.  The lens nucleus was then prolapsed into the anterior chamber with balanced salt solution on a fine cannula and was removed with a phacoemulsification handpiece.  A residual cortex was removed with irrigation and aspiration.  The posterior capsule was completely intact at the conclusion of the step.  The anterior chamber was then filled with Provisc and an intraocular lens was placed into the anterior chamber with its injecting system; however, the trailing haptic was amputated.  This necessitated widening the incision to approximately 2.75 mm to retrieve the partially prolapsed intraocular lens back through the incision.  A second implant was then placed into the capsular bag without difficulty and centered well with posterior capsule remained intact.  The Provisc was then removed from the anterior chamber and capsular bag with irrigation and aspiration.  Stromal hydration of the main incision and paracentesis port was performed with balanced salt solution on a fine cannula.  There was no leak.  The patient tolerated the procedure well.  There were no operative complications, and she was returned to the recovery area in satisfactory condition.  There were no surgical specimens. Prosthetic  device used is a Lenstec posterior chamber intraocular lens, model Softec HD, power of 19.75, serial number is [id number].          ______________________________ Susanne Greenhouse, MD     KEH/MEDQ  D:  11/27/2011  T:  11/27/2011  Job:  [id number]

## 2011-11-27 NOTE — Anesthesia Postprocedure Evaluation (Signed)
  Anesthesia Post-op Note  Patient: Connie Aguilar  Procedure(s) Performed: Procedure(s) (LRB): CATARACT EXTRACTION PHACO AND INTRAOCULAR LENS PLACEMENT (IOC) (Left)  Patient Location:  Short Stay  Anesthesia Type: MAC  Level of Consciousness: awake  Airway and Oxygen Therapy: Patient Spontanous Breathing  Post-op Pain: none  Post-op Assessment: Post-op Vital signs reviewed, Patient's Cardiovascular Status Stable, Respiratory Function Stable, Patent Airway, No signs of Nausea or vomiting and Pain level controlled  Post-op Vital Signs: Reviewed and stable  Complications: No apparent anesthesia complications

## 2011-11-29 ENCOUNTER — Encounter (HOSPITAL_COMMUNITY): Payer: Self-pay | Admitting: Ophthalmology

## 2012-02-14 ENCOUNTER — Other Ambulatory Visit: Payer: Self-pay | Admitting: Ophthalmology

## 2012-02-15 ENCOUNTER — Encounter (HOSPITAL_COMMUNITY): Payer: Self-pay | Admitting: *Deleted

## 2012-02-15 NOTE — H&P (Signed)
Connie Aguilar is an 36 y.o. female.   Chief Complaint: VISION LOSS LEFT EYE FROM DENSE VITREOUS HEMORRHAGE. HPI: PROLIFERATIVE DIABETIC RETINOPATHY LEFT EYE WITH VISION LOSS FROM DIABETIC VITREOUS HEMORRHAGE.    Past Medical History  Diagnosis Date  . Diabetes mellitus   . GERD (gastroesophageal reflux disease)   . Hypertension   . Bronchitis     hx of as child  . PONV (postoperative nausea and vomiting)   . Pneumonia     hx of as child    Past Surgical History  Procedure Date  . Wisdom tooth extraction   . Pars plana vitrectomy 09/13/2011    Procedure: PARS PLANA VITRECTOMY WITH 25 GAUGE;  Surgeon: Edmon Crape, MD;  Location: [facility] OR;  Service: Ophthalmology;  Laterality: Right;  . Pars plana vitrectomy 10/27/2011    Procedure: PARS PLANA VITRECTOMY WITH 25 GAUGE;  Surgeon: Edmon Crape, MD;  Location: [facility] OR;  Service: Ophthalmology;  Laterality: Right;  . Eye surgery march 2013, laser prp  OU    vitrectomy  . Cataract extraction w/phaco 11/27/2011    Procedure: CATARACT EXTRACTION PHACO AND INTRAOCULAR LENS PLACEMENT (IOC);  Surgeon: Gemma Payor, MD;  Location: [facility] ORS;  Service: Ophthalmology;  Laterality: Left;  CDE:3.02    Family History  Problem Relation Age of Onset  . Cancer Mother   . Hypertension Mother   . Diabetes Mother   . Diabetes Father   . Diabetes Sister   . Anesthesia problems Neg Hx   . Hypotension Neg Hx   . Malignant hyperthermia Neg Hx   . Pseudochol deficiency Neg Hx    Social History:  reports that she quit smoking about 12 months ago. Her smoking use included Cigarettes. She has a 7.5 pack-year smoking history. She does not have any smokeless tobacco history on file. She reports that she drinks alcohol. She reports that she does not use illicit drugs.  Allergies:  Allergies  Allergen Reactions  . Penicillins Rash    No prescriptions prior to admission    No results found for this or any previous visit (from the past 48 hour(s)). No results  found.  Review of Systems  Constitutional: Negative.   HENT: Negative.   Eyes: Positive for blurred vision.  Respiratory: Negative.   Cardiovascular: Negative.   Gastrointestinal: Negative.   Genitourinary: Negative.   Musculoskeletal: Negative.   Skin: Negative.   Neurological: Negative.   Endo/Heme/Allergies: Negative.   Psychiatric/Behavioral: Negative.     Last menstrual period 01/28/2012. Physical Exam  Constitutional: She appears well-developed and well-nourished.  HENT:  Head: Normocephalic.  Eyes: Conjunctivae, EOM and lids are normal. Pupils are equal, round, and reactive to light.    Neck: Normal range of motion.  Cardiovascular: Normal rate, regular rhythm and normal heart sounds.   Respiratory: Effort normal and breath sounds normal.  GI: Soft. Normal appearance.  Neurological: She is alert.  Skin: Skin is intact.  Psychiatric: She has a normal mood and affect. Her speech is normal. Judgment normal. Cognition and memory are normal.     Assessment/Plan PLAN PARS PLANA VITRECTOMY AND ENDOLASER LEFT EYE UNDER LOCAL ANESTHESIA, MAC. TO CLEAR VISUAL AXIS, IMPROVE VISION AND FUNCTIONING, AND INDUCE QUIESCENT DIABETIC RETINOPATHY.   RANKIN,GARY A 02/15/2012, 10:39 PM

## 2012-02-16 ENCOUNTER — Encounter (HOSPITAL_COMMUNITY): Payer: Self-pay | Admitting: *Deleted

## 2012-02-16 ENCOUNTER — Ambulatory Visit (HOSPITAL_COMMUNITY): Payer: 59 | Admitting: Certified Registered Nurse Anesthetist

## 2012-02-16 ENCOUNTER — Ambulatory Visit (HOSPITAL_COMMUNITY)
Admission: RE | Admit: 2012-02-16 | Discharge: 2012-02-16 | Disposition: A | Discharge status: Home / Self Care | Payer: 59 | Source: Ambulatory Visit | Attending: Ophthalmology | Admitting: Ophthalmology

## 2012-02-16 ENCOUNTER — Encounter (HOSPITAL_COMMUNITY): Payer: Self-pay | Admitting: Certified Registered Nurse Anesthetist

## 2012-02-16 ENCOUNTER — Encounter (HOSPITAL_COMMUNITY)
Admission: RE | Disposition: A | Discharge status: Home / Self Care | Payer: Self-pay | Source: Ambulatory Visit | Attending: Ophthalmology

## 2012-02-16 DIAGNOSIS — E11359 Type 2 diabetes mellitus with proliferative diabetic retinopathy without macular edema: Secondary | ICD-10-CM | POA: Insufficient documentation

## 2012-02-16 DIAGNOSIS — K219 Gastro-esophageal reflux disease without esophagitis: Secondary | ICD-10-CM | POA: Insufficient documentation

## 2012-02-16 DIAGNOSIS — E1139 Type 2 diabetes mellitus with other diabetic ophthalmic complication: Secondary | ICD-10-CM | POA: Insufficient documentation

## 2012-02-16 DIAGNOSIS — Z9849 Cataract extraction status, unspecified eye: Secondary | ICD-10-CM | POA: Insufficient documentation

## 2012-02-16 DIAGNOSIS — I10 Essential (primary) hypertension: Secondary | ICD-10-CM | POA: Insufficient documentation

## 2012-02-16 DIAGNOSIS — H3340 Traction detachment of retina, unspecified eye: Secondary | ICD-10-CM | POA: Insufficient documentation

## 2012-02-16 DIAGNOSIS — Z8701 Personal history of pneumonia (recurrent): Secondary | ICD-10-CM | POA: Insufficient documentation

## 2012-02-16 DIAGNOSIS — H4310 Vitreous hemorrhage, unspecified eye: Secondary | ICD-10-CM | POA: Insufficient documentation

## 2012-02-16 HISTORY — PX: GAS/FLUID EXCHANGE: SHX5334

## 2012-02-16 HISTORY — PX: PARS PLANA VITRECTOMY: SHX2166

## 2012-02-16 LAB — CBC
Platelets: 229 10*3/uL (ref 150–400)
RBC: 4.64 MIL/uL (ref 3.87–5.11)
RDW: 13.6 % (ref 11.5–15.5)
WBC: 7.4 10*3/uL (ref 4.0–10.5)

## 2012-02-16 LAB — GLUCOSE, CAPILLARY
Glucose-Capillary: 78 mg/dL (ref 70–99)
Glucose-Capillary: 81 mg/dL (ref 70–99)
Glucose-Capillary: 85 mg/dL (ref 70–99)

## 2012-02-16 LAB — SURGICAL PCR SCREEN
MRSA, PCR: NEGATIVE
Staphylococcus aureus: NEGATIVE

## 2012-02-16 LAB — BASIC METABOLIC PANEL
Calcium: 9.4 mg/dL (ref 8.4–10.5)
Creatinine, Ser: 0.94 mg/dL (ref 0.50–1.10)
GFR calc non Af Amer: 79 mL/min — ABNORMAL LOW (ref >90)
Sodium: 141 mEq/L (ref 135–145)

## 2012-02-16 LAB — HCG, SERUM, QUALITATIVE: Preg, Serum: NEGATIVE

## 2012-02-16 SURGERY — PARS PLANA VITRECTOMY WITH 25 GAUGE
Anesthesia: General | Site: Eye | Laterality: Left | Wound class: Clean

## 2012-02-16 SURGERY — PARS PLANA VITRECTOMY WITH 25 GAUGE
Anesthesia: Monitor Anesthesia Care | Laterality: Left

## 2012-02-16 MED ORDER — HYPROMELLOSE (GONIOSCOPIC) 2.5 % OP SOLN
OPHTHALMIC | Status: DC | PRN
  Administered 2012-02-16: 1 [drp] via OPHTHALMIC

## 2012-02-16 MED ORDER — DEXAMETHASONE SODIUM PHOSPHATE 10 MG/ML IJ SOLN
INTRAMUSCULAR | Status: AC
  Filled 2012-02-16: qty 1

## 2012-02-16 MED ORDER — PROMETHAZINE HCL 25 MG/ML IJ SOLN: 6.2500 mg | INTRAMUSCULAR | Status: DC | PRN

## 2012-02-16 MED ORDER — BSS PLUS IO SOLN
INTRAOCULAR | Status: AC
  Filled 2012-02-16: qty 500

## 2012-02-16 MED ORDER — MUPIROCIN 2 % EX OINT
TOPICAL_OINTMENT | Freq: Two times a day (BID) | CUTANEOUS | Status: DC
  Administered 2012-02-16: 1 via NASAL
  Filled 2012-02-16: qty 22

## 2012-02-16 MED ORDER — BSS PLUS IO SOLN
INTRAOCULAR | Status: DC | PRN
  Administered 2012-02-16: 1 via OPHTHALMIC

## 2012-02-16 MED ORDER — ONDANSETRON HCL 4 MG/2ML IJ SOLN
INTRAMUSCULAR | Status: DC | PRN
  Administered 2012-02-16: 4 mg via INTRAVENOUS

## 2012-02-16 MED ORDER — MEPERIDINE HCL 25 MG/ML IJ SOLN: 6.2500 mg | INTRAMUSCULAR | Status: DC | PRN

## 2012-02-16 MED ORDER — PHENYLEPHRINE HCL 10 MG/ML IJ SOLN
INTRAMUSCULAR | Status: DC | PRN
  Administered 2012-02-16: 40 ug via INTRAVENOUS

## 2012-02-16 MED ORDER — PHENYLEPHRINE HCL 2.5 % OP SOLN
1.0000 [drp] | OPHTHALMIC | Status: AC
  Administered 2012-02-16 (×3): 1 [drp] via OPHTHALMIC
  Filled 2012-02-16: qty 3

## 2012-02-16 MED ORDER — ROCURONIUM BROMIDE 100 MG/10ML IV SOLN
INTRAVENOUS | Status: DC | PRN
  Administered 2012-02-16: 40 mg via INTRAVENOUS

## 2012-02-16 MED ORDER — GATIFLOXACIN 0.5 % OP SOLN
1.0000 [drp] | OPHTHALMIC | Status: AC | PRN
  Administered 2012-02-16 (×3): 1 [drp] via OPHTHALMIC
  Filled 2012-02-16: qty 2.5

## 2012-02-16 MED ORDER — GENTAMICIN SULFATE 40 MG/ML IJ SOLN
INTRAMUSCULAR | Status: AC
  Filled 2012-02-16: qty 2

## 2012-02-16 MED ORDER — PROMETHAZINE HCL 25 MG/ML IJ SOLN
INTRAMUSCULAR | Status: AC
  Filled 2012-02-16: qty 1

## 2012-02-16 MED ORDER — ACETAMINOPHEN 325 MG PO TABS
325.0000 mg | ORAL_TABLET | ORAL | Status: DC | PRN
  Administered 2012-02-16: 650 mg via ORAL

## 2012-02-16 MED ORDER — HYPROMELLOSE (GONIOSCOPIC) 2.5 % OP SOLN
OPHTHALMIC | Status: AC
  Filled 2012-02-16: qty 15

## 2012-02-16 MED ORDER — SCOPOLAMINE 1 MG/3DAYS TD PT72
MEDICATED_PATCH | TRANSDERMAL | Status: DC | PRN
  Administered 2012-02-16: 1 via TRANSDERMAL

## 2012-02-16 MED ORDER — TROPICAMIDE 1 % OP SOLN
1.0000 [drp] | OPHTHALMIC | Status: AC | PRN
  Administered 2012-02-16 (×3): 1 [drp] via OPHTHALMIC
  Filled 2012-02-16: qty 3

## 2012-02-16 MED ORDER — LACTATED RINGERS IV SOLN: INTRAVENOUS | Status: AC

## 2012-02-16 MED ORDER — EPINEPHRINE HCL 1 MG/ML IJ SOLN
INTRAOCULAR | Status: DC | PRN
  Administered 2012-02-16: 08:00:00

## 2012-02-16 MED ORDER — PROPOFOL 10 MG/ML IV BOLUS
INTRAVENOUS | Status: DC | PRN
  Administered 2012-02-16: 200 mg via INTRAVENOUS

## 2012-02-16 MED ORDER — BSS IO SOLN
INTRAOCULAR | Status: DC | PRN
  Administered 2012-02-16: 15 mL via INTRAOCULAR

## 2012-02-16 MED ORDER — FENTANYL CITRATE 0.05 MG/ML IJ SOLN
INTRAMUSCULAR | Status: DC | PRN
  Administered 2012-02-16: 100 ug via INTRAVENOUS

## 2012-02-16 MED ORDER — DEXAMETHASONE SODIUM PHOSPHATE 10 MG/ML IJ SOLN
INTRAMUSCULAR | Status: DC | PRN
  Administered 2012-02-16: 10 mg

## 2012-02-16 MED ORDER — SODIUM HYALURONATE 10 MG/ML IO SOLN
INTRAOCULAR | Status: AC
  Filled 2012-02-16: qty 0.85

## 2012-02-16 MED ORDER — LACTATED RINGERS IV SOLN
INTRAVENOUS | Status: DC | PRN
  Administered 2012-02-16: 08:00:00 via INTRAVENOUS

## 2012-02-16 MED ORDER — MIDAZOLAM HCL 5 MG/ML IJ SOLN
INTRAMUSCULAR | Status: DC | PRN
  Administered 2012-02-16: 2 mg via INTRAVENOUS

## 2012-02-16 MED ORDER — MUPIROCIN 2 % EX OINT
TOPICAL_OINTMENT | CUTANEOUS | Status: AC
  Administered 2012-02-16: 1 via NASAL
  Filled 2012-02-16: qty 22

## 2012-02-16 MED ORDER — PROMETHAZINE HCL 25 MG/ML IJ SOLN
INTRAMUSCULAR | Status: DC | PRN
  Administered 2012-02-16: 12.5 mg via INTRAVENOUS

## 2012-02-16 MED ORDER — LIDOCAINE HCL 2 % IJ SOLN
INTRAMUSCULAR | Status: AC
  Filled 2012-02-16: qty 1

## 2012-02-16 MED ORDER — MIDAZOLAM HCL 2 MG/2ML IJ SOLN: 0.5000 mg | Freq: Once | INTRAMUSCULAR | Status: DC | PRN

## 2012-02-16 MED ORDER — HYDROMORPHONE HCL PF 1 MG/ML IJ SOLN: 0.2500 mg | INTRAMUSCULAR | Status: DC | PRN

## 2012-02-16 MED ORDER — EPINEPHRINE HCL 1 MG/ML IJ SOLN
INTRAMUSCULAR | Status: AC
  Filled 2012-02-16: qty 1

## 2012-02-16 MED ORDER — SCOPOLAMINE 1 MG/3DAYS TD PT72
1.0000 | MEDICATED_PATCH | TRANSDERMAL | Status: DC
  Filled 2012-02-16: qty 1

## 2012-02-16 MED ORDER — LIDOCAINE HCL (CARDIAC) 20 MG/ML IV SOLN
INTRAVENOUS | Status: DC | PRN
  Administered 2012-02-16: 30 mg via INTRAVENOUS

## 2012-02-16 MED ORDER — NA CHONDROIT SULF-NA HYALURON 40-30 MG/ML IO SOLN
INTRAOCULAR | Status: AC
  Filled 2012-02-16: qty 0.5

## 2012-02-16 MED ORDER — TETRACAINE HCL 0.5 % OP SOLN
OPHTHALMIC | Status: AC
  Filled 2012-02-16: qty 2

## 2012-02-16 MED ORDER — POLYMYXIN B SULFATE 500000 UNITS IJ SOLR
INTRAMUSCULAR | Status: AC
  Filled 2012-02-16: qty 1

## 2012-02-16 MED ORDER — ACETAMINOPHEN 325 MG PO TABS
ORAL_TABLET | ORAL | Status: AC
  Filled 2012-02-16: qty 2

## 2012-02-16 SURGICAL SUPPLY — 63 items
ACCESSORY FRAGMATOME (MISCELLANEOUS) IMPLANT
APL SRG 3 HI ABS STRL LF PLS (MISCELLANEOUS)
APPLICATOR COTTON TIP 6IN STRL (MISCELLANEOUS) ×3 IMPLANT
APPLICATOR DR MATTHEWS STRL (MISCELLANEOUS) IMPLANT
CANNULA ANT CHAM MAIN (OPHTHALMIC RELATED) IMPLANT
CANNULA FLEX TIP 25G (CANNULA) ×2 IMPLANT
CLOTH BEACON ORANGE TIMEOUT ST (SAFETY) ×3 IMPLANT
CORDS BIPOLAR (ELECTRODE) IMPLANT
DRAPE OPHTHALMIC 77X100 STRL (CUSTOM PROCEDURE TRAY) ×3 IMPLANT
FILTER BLUE MILLIPORE (MISCELLANEOUS) IMPLANT
FORCEPS ECKARDT ILM 25G SERR (OPHTHALMIC RELATED) ×2 IMPLANT
FORCEPS HORIZONTAL 25G DISP (OPHTHALMIC RELATED) IMPLANT
GAS ISPAN SULFUR HEXAFLUORIDE (MISCELLANEOUS) ×2 IMPLANT
GAS OPHTHALMIC (MISCELLANEOUS) IMPLANT
GLOVE BIO SURGEON STRL SZ7.5 (GLOVE) ×2 IMPLANT
GLOVE SS BIOGEL STRL SZ 8.5 (GLOVE) ×2 IMPLANT
GLOVE SUPERSENSE BIOGEL SZ 8.5 (GLOVE) ×1
GOWN STRL NON-REIN LRG LVL3 (GOWN DISPOSABLE) ×5 IMPLANT
ILLUMINATOR ENDO 25GA (MISCELLANEOUS) ×3 IMPLANT
KIT BASIN OR (CUSTOM PROCEDURE TRAY) ×3 IMPLANT
KIT PERFLUORON PROCEDURE 5ML (MISCELLANEOUS) IMPLANT
KIT ROOM TURNOVER OR (KITS) ×3 IMPLANT
KNIFE CRESCENT 2.5 55 ANG (BLADE) IMPLANT
LENS BIOM SUPER VIEW SET DISP (OPHTHALMIC RELATED) ×3 IMPLANT
MARKER SKIN DUAL TIP RULER LAB (MISCELLANEOUS) IMPLANT
MASK EYE SHIELD (GAUZE/BANDAGES/DRESSINGS) ×2 IMPLANT
MICROPICK 25G (MISCELLANEOUS)
NDL 18GX1X1/2 (RX/OR ONLY) (NEEDLE) IMPLANT
NDL 25GX 5/8IN NON SAFETY (NEEDLE) IMPLANT
NDL FILTER BLUNT 18X1 1/2 (NEEDLE) IMPLANT
NDL HYPO 25GX1X1/2 BEV (NEEDLE) IMPLANT
NEEDLE 18GX1X1/2 (RX/OR ONLY) (NEEDLE) ×3 IMPLANT
NEEDLE 25GX 5/8IN NON SAFETY (NEEDLE) ×3 IMPLANT
NEEDLE FILTER BLUNT 18X 1/2SAF (NEEDLE)
NEEDLE FILTER BLUNT 18X1 1/2 (NEEDLE) IMPLANT
NEEDLE HYPO 25GX1X1/2 BEV (NEEDLE) ×3 IMPLANT
NS IRRIG 1000ML POUR BTL (IV SOLUTION) ×3 IMPLANT
PACK VITRECTOMY CUSTOM (CUSTOM PROCEDURE TRAY) ×3 IMPLANT
PAD ARMBOARD 7.5X6 YLW CONV (MISCELLANEOUS) ×6 IMPLANT
PAD EYE OVAL STERILE LF (GAUZE/BANDAGES/DRESSINGS) ×2 IMPLANT
PAK VITRECTOMY PIK 25 GA (OPHTHALMIC RELATED) ×3 IMPLANT
PENCIL BIPOLAR 25GA STR DISP (OPHTHALMIC RELATED) IMPLANT
PICK MICROPICK 25G (MISCELLANEOUS) IMPLANT
PROBE DIRECTIONAL LASER (MISCELLANEOUS) ×2 IMPLANT
ROLLS DENTAL (MISCELLANEOUS) IMPLANT
SCRAPER DIAMOND 25GA (OPHTHALMIC RELATED) IMPLANT
SET FLUID INJECTOR (SET/KITS/TRAYS/PACK) IMPLANT
STOCKINETTE IMPERVIOUS 9X36 MD (GAUZE/BANDAGES/DRESSINGS) ×6 IMPLANT
STOPCOCK 4 WAY LG BORE MALE ST (IV SETS) IMPLANT
SUT ETHILON 10 0 CS140 6 (SUTURE) IMPLANT
SUT ETHILON 8 0 BV130 4 (SUTURE) IMPLANT
SUT MERSILENE 5 0 RD 1 DA (SUTURE) IMPLANT
SUT PROLENE 10 0 CIF 4 DA (SUTURE) IMPLANT
SUT VICRYL 7 0 TG140 8 (SUTURE) IMPLANT
SYR 30ML SLIP (SYRINGE) IMPLANT
SYR 50ML SLIP (SYRINGE) ×2 IMPLANT
SYR 5ML LL (SYRINGE) IMPLANT
SYR TB 1ML LUER SLIP (SYRINGE) IMPLANT
TAPE SURG TRANSPORE 1 IN (GAUZE/BANDAGES/DRESSINGS) ×1 IMPLANT
TAPE SURGICAL TRANSPORE 1 IN (GAUZE/BANDAGES/DRESSINGS) ×1
TOWEL OR 17X24 6PK STRL BLUE (TOWEL DISPOSABLE) ×6 IMPLANT
WATER STERILE IRR 1000ML POUR (IV SOLUTION) ×3 IMPLANT
WIPE INSTRUMENT VISIWIPE 73X73 (MISCELLANEOUS) ×3 IMPLANT

## 2012-02-16 NOTE — Progress Notes (Signed)
Care of pt assumed by MA Fredrich Cory RN 

## 2012-02-16 NOTE — Brief Op Note (Signed)
02/16/2012  9:11 AM  PATIENT:  Connie Aguilar  36 y.o. female  PRE-OPERATIVE DIAGNOSIS:  vitreous hemorrahage left eye, proliferative diabetic retinopathy   POST-OPERATIVE DIAGNOSIS:  vitreous hemorrahage left eye, proliferative diabetic retinopathy, traction retinal detachment, and rhegmatogenous retinal detachment  PROCEDURE:  Procedure(s) (LRB): PARS PLANA VITRECTOMY WITH 25 GAUGE (Left) DIODE LASER APPLICATION (Left) GAS/FLUID EXCHANGE (Left), membrane peel , injection SF6 20%.  SURGEON:  Surgeon(s) and Role:    * Edmon Crape, MD - Primary  PHYSICIAN ASSISTANT:   ASSISTANTS: none   ANESTHESIA:   general  EBL:  Total I/O In: 500 [I.V.:500] Out: -   BLOOD ADMINISTERED:none  DRAINS: none   LOCAL MEDICATIONS USED:  NONE  SPECIMEN:  No Specimen  DISPOSITION OF SPECIMEN:  N/A  COUNTS:  YES  TOURNIQUET:  * No tourniquets in log *  DICTATION: .Other Dictation: Dictation Number [id number]  PLAN OF CARE: Discharge to home after PACU  PATIENT DISPOSITION:  PACU - hemodynamically stable.   Delay start of Pharmacological VTE agent (>24hrs) due to surgical blood loss or risk of bleeding: not applicable

## 2012-02-16 NOTE — Anesthesia Preprocedure Evaluation (Signed)
Anesthesia Evaluation  Patient identified by MRN, date of birth, ID band Patient awake    Reviewed: Allergy & Precautions, H&P , NPO status , Patient's Chart, lab work & pertinent test results, reviewed documented beta blocker date and time   History of Anesthesia Complications (+) PONV  Airway Mallampati: II TM Distance: >3 FB Neck ROM: Full    Dental No notable dental hx. (+) Teeth Intact and Dental Advisory Given   Pulmonary neg pneumonia -, former smoker (quit 2-3 years),  breath sounds clear to auscultation  Pulmonary exam normal       Cardiovascular hypertension, Pt. on medications and Pt. on home beta blockers Rhythm:Regular Rate:Normal     Neuro/Psych negative neurological ROS     GI/Hepatic Neg liver ROS, GERD-  Controlled,  Endo/Other  Well Controlled, Type 1, Insulin DependentMorbid obesity  Renal/GU negative Renal ROS     Musculoskeletal   Abdominal (+) + obese,   Peds  Hematology   Anesthesia Other Findings   Reproductive/Obstetrics LMP first week august                            Anesthesia Physical Anesthesia Plan  ASA: III  Anesthesia Plan: General   Post-op Pain Management:    Induction: Intravenous  Airway Management Planned: Oral ETT  Additional Equipment:   Intra-op Plan:   Post-operative Plan: Extubation in OR  Informed Consent: I have reviewed the patients History and Physical, chart, labs and discussed the procedure including the risks, benefits and alternatives for the proposed anesthesia with the patient or authorized representative who has indicated his/her understanding and acceptance.   Dental advisory given  Plan Discussed with: CRNA and Surgeon  Anesthesia Plan Comments: (Plan routine monitors, GETA)        Anesthesia Quick Evaluation

## 2012-02-16 NOTE — Transfer of Care (Signed)
Immediate Anesthesia Transfer of Care Note  Patient: Connie Aguilar  Procedure(s) Performed: Procedure(s) (LRB): PARS PLANA VITRECTOMY WITH 25 GAUGE (Left) DIODE LASER APPLICATION (Left) GAS/FLUID EXCHANGE (Left)  Patient Location: PACU  Anesthesia Type: General  Level of Consciousness: awake and pateint uncooperative  Airway & Oxygen Therapy: Patient Spontanous Breathing and Patient connected to face mask oxygen  Post-op Assessment: Report given to PACU RN, Post -op Vital signs reviewed and stable and Patient moving all extremities  Post vital signs: Reviewed and stable  Complications: No apparent anesthesia complications

## 2012-02-16 NOTE — Anesthesia Postprocedure Evaluation (Signed)
  Anesthesia Post-op Note  Patient: Connie Aguilar  Procedure(s) Performed: Procedure(s) (LRB): PARS PLANA VITRECTOMY WITH 25 GAUGE (Left) DIODE LASER APPLICATION (Left) GAS/FLUID EXCHANGE (Left)  Patient Location: PACU  Anesthesia Type: General  Level of Consciousness: sedated and responds to stimulation  Airway and Oxygen Therapy: Patient Spontanous Breathing and Patient connected to nasal cannula oxygen  Post-op Pain: none  Post-op Assessment: Post-op Vital signs reviewed, Patient's Cardiovascular Status Stable, Respiratory Function Stable, Patent Airway, No signs of Nausea or vomiting and Pain level controlled  Post-op Vital Signs: Reviewed and stable  Complications: No apparent anesthesia complications

## 2012-02-17 NOTE — Op Note (Signed)
NAMEKYNLEE, KOENIGSBERG                   ACCOUNT NO.:  [id number]  MEDICAL RECORD NO.:  [id number]  LOCATION:  [facility]                         FACILITY:  [facility]  PHYSICIAN:  Jillyn Hidden A. Cherese Lozano, M.D.   DATE OF BIRTH:  1976/03/11  DATE OF PROCEDURE:  02/16/2012 DATE OF DISCHARGE:  02/16/2012                              OPERATIVE REPORT   PREOPERATIVE DIAGNOSES: 1. Dense vitreous hemorrhage, left eye. 2. Proliferative diabetic retinopathy, progressive left eye.  POSTOPERATIVE DIAGNOSES: 1. Dense vitreous hemorrhage, left eye. 2. Proliferative diabetic retinopathy, progressive left eye. 3. Tractional detachment, left eye. 4. Combined retinal tractional detachment, left eye.  SURGEON:  Alford Highland. Dajahnae Vondra, MD  ANESTHESIA:  General anesthesia.  INDICATION FOR PROCEDURE:  The patient is a 36 year old woman who has type 1 diabetes mellitus, advanced proliferative diabetic retinopathy with vitreous hemorrhage and recurrent in the previous right eye, now repaired via vitrectomy.  The left eye has long-standing vitreous hemorrhage and had previous panretinal photocoagulation, has progressive proliferative diabetic retinopathy, tractional detachment superotemporally down at time of surgery.  The patient understands this is an attempt to clear the __________ but more importantly to stabilize proliferative diabetic retinopathy so as to prevent progression to more complicated retinal detachment.  The patient understands the risks of anesthesia including rare occurrence of death, loss of the eye including but not limited to hemorrhage, infection, scarring, need for another surgery, change in vision, loss of vision, progressive disease despite intervention.  PROCEDURE IN DETAIL:  After appropriate signed consent was obtained, the patient was taken to the operating room.  In the operating room, appropriate monitors followed by mild sedation.  Proper site collection confirmed with the operating staff.   Thereafter, general anesthesia was induced without difficulty.  Left periocular region was prepped and draped in usual ophthalmic fashion.  Lid speculum applied.  A 25-gauge trocar placed in the inferotemporal quadrant.  Superior trocar applied. Core vitrectomy was then begun.  Notable findings of the vitreoretinal attached to the optic nerve and peripheral vitreous cone was then entered, circumcised 360 degrees.  Scleral depression was then used to trim the vitreous base which had inspissated vitreous hemorrhage. Vitreous space dissection inferiorly to the anterior hyaloid firmly removed.  There were dense vitreal attachments from the 12 o'clock, 3 o'clock position just anterior to the equator with tractional retinal detachment of this quadrant.  Membrane peel was completed in this area using __________, but also the 25-gauge vitrectomy instrument in typical fashion.  Small retinal hole was fashioned and fluid exchange carried out with vitrectomy instrumentation on aspiration and a thick slurry of subretinal fluid removed from this quadrant.  The detachment did extend slightly posterior to the equator.  Thick subretinal fluid was completely removed.  Now, fluid-air exchange was completed.  Endolaser photocoagulation placed in panretinal fashion and finally in the bed of detachment.  Smaller retinal hole had been found at the 6 o'clock position inferiorly, but __________ flattened and nicely reattached __________ reattached with no accumulation of fluid.  The subretinal fluid in the superotemporal quadrant was re-aspirated on multiple occasions. Retinopexy was effective and secured the retina in this place.  At this time, air __________ exchanged, completed  __________ 20% injected.  Superior trocar was removed from the eye.  The infusion was then removed and __________.  A sterile patch and Fox shield applied after subconjunctival Decadron applied.  The patient was taken to PACU in good  stable condition.  Tolerated the procedure without complications.     Alford Highland Amaia Lavallie, M.D.     GAR/MEDQ  D:  02/16/2012  T:  02/17/2012  Job:  [id number]

## 2012-02-20 ENCOUNTER — Encounter (HOSPITAL_COMMUNITY): Payer: Self-pay | Admitting: Ophthalmology

## 2012-06-17 ENCOUNTER — Encounter (HOSPITAL_COMMUNITY): Payer: Self-pay | Admitting: Emergency Medicine

## 2012-06-17 ENCOUNTER — Emergency Department (INDEPENDENT_AMBULATORY_CARE_PROVIDER_SITE_OTHER)
Admission: EM | Admit: 2012-06-17 | Discharge: 2012-06-17 | Disposition: A | Discharge status: Home / Self Care | Payer: 59 | Source: Home / Self Care | Attending: Emergency Medicine | Admitting: Emergency Medicine

## 2012-06-17 ENCOUNTER — Emergency Department (INDEPENDENT_AMBULATORY_CARE_PROVIDER_SITE_OTHER): Payer: 59

## 2012-06-17 DIAGNOSIS — M2390 Unspecified internal derangement of unspecified knee: Secondary | ICD-10-CM

## 2012-06-17 MED ORDER — HYDROCODONE-IBUPROFEN 7.5-200 MG PO TABS: 1.0000 | ORAL_TABLET | Freq: Three times a day (TID) | ORAL | Status: DC | PRN

## 2012-06-17 NOTE — ED Notes (Signed)
Pt c/o left knee inj that happened around 13:30... States she was walking while working when she heard a "popping" sound... States that her knee was bothering her a "couple" of days prior to today... She is alert w/no signs of acute distress.

## 2012-06-17 NOTE — ED Provider Notes (Signed)
History     CSN: [id number]  Arrival date & time 06/17/12  1516   First MD Initiated Contact with Patient 06/17/12 1624      Chief Complaint  Patient presents with  . Knee Injury    (Consider location/radiation/quality/duration/timing/severity/associated sxs/prior treatment) HPI Comments: Patient presents urgent care this evening complaining of sudden onset of left knee pain (patient point to the anterior and medial aspect of her left knee). As she was walking  while working about 2 days ago she heard a popping sound coming from her knee and she immediately start expressing pain. She started experiencing A. exacerbated pain and puffiness and swelling of her left knee. It hurts when she walks on it or when she's tries to move or extend her knee.  Patient is a 36 y.o. female presenting with knee pain. The history is provided by the patient.  Knee Pain This is a new problem. The current episode started 12 to 24 hours ago. The problem occurs constantly. The problem has been gradually worsening. The symptoms are aggravated by walking and twisting. Nothing relieves the symptoms. She has tried nothing for the symptoms.    Past Medical History  Diagnosis Date  . Diabetes mellitus   . GERD (gastroesophageal reflux disease)   . Hypertension   . Bronchitis     hx of as child  . PONV (postoperative nausea and vomiting)   . Pneumonia     hx of as child    Past Surgical History  Procedure Date  . Wisdom tooth extraction   . Pars plana vitrectomy 09/13/2011    Procedure: PARS PLANA VITRECTOMY WITH 25 GAUGE;  Surgeon: Edmon Crape, MD;  Location: [facility] OR;  Service: Ophthalmology;  Laterality: Right;  . Pars plana vitrectomy 10/27/2011    Procedure: PARS PLANA VITRECTOMY WITH 25 GAUGE;  Surgeon: Edmon Crape, MD;  Location: [facility] OR;  Service: Ophthalmology;  Laterality: Right;  . Eye surgery march 2013, laser prp  OU    vitrectomy  . Cataract extraction w/phaco 11/27/2011    Procedure:  CATARACT EXTRACTION PHACO AND INTRAOCULAR LENS PLACEMENT (IOC);  Surgeon: Gemma Payor, MD;  Location: [facility];  Service: Ophthalmology;  Laterality: Left;  CDE:3.02  . Pars plana vitrectomy 02/16/2012    Procedure: PARS PLANA VITRECTOMY WITH 25 GAUGE;  Surgeon: Edmon Crape, MD;  Location: [facility] OR;  Service: Ophthalmology;  Laterality: Left;  Repair Retinal Detachment  . Gas/fluid exchange 02/16/2012    Procedure: GAS/FLUID EXCHANGE;  Surgeon: Edmon Crape, MD;  Location: [facility] OR;  Service: Ophthalmology;  Laterality: Left;  SF6    Family History  Problem Relation Age of Onset  . Cancer Mother   . Hypertension Mother   . Diabetes Mother   . Diabetes Father   . Diabetes Sister   . Anesthesia problems Neg Hx   . Hypotension Neg Hx   . Malignant hyperthermia Neg Hx   . Pseudochol deficiency Neg Hx     History  Substance Use Topics  . Smoking status: Former Smoker -- 0.5 packs/[place] for 15 years    Types: Cigarettes    Quit date: 01/18/2011  . Smokeless tobacco: Not on file  . Alcohol Use: Yes     Comment: occ- <1 a week    OB History    Grav Para Term Preterm Abortions TAB SAB Ect Mult Living   1 1  1      1       Review of Systems  Constitutional:  Negative for fever, activity change, appetite change and fatigue.  Cardiovascular: Negative for leg swelling.  Musculoskeletal: Negative for myalgias and joint swelling.  Skin: Negative for color change, pallor, rash and wound.    Allergies  Penicillins  Home Medications   Current Outpatient Rx  Name  Route  Sig  Dispense  Refill  . INSULIN LISPRO PROT & LISPRO (50-50) 100 UNIT/ML [place] SUSP   Subcutaneous   Inject 20 Units into the skin at bedtime.         . INSULIN LISPRO PROT & LISPRO (75-25) 100 UNIT/ML [place] SUSP   Subcutaneous   Inject 36 Units into the skin daily with breakfast.         . LOSARTAN POTASSIUM 50 MG PO TABS   Oral   Take 50 mg by mouth daily.         Marland Kitchen METOPROLOL SUCCINATE ER 100 MG PO TB24   Oral    Take 100 mg by mouth daily. Take with or immediately following a meal.         . ACETAMINOPHEN 500 MG PO TABS   Oral   Take 1,000 mg by mouth every 6 (six) hours as needed. Pain         . HYDROCODONE-IBUPROFEN 7.5-200 MG PO TABS   Oral   Take 1 tablet by mouth every 8 (eight) hours as needed for pain.   15 tablet   0   . OFLOXACIN 0.3 % OP SOLN   Left Eye   Place 1 drop into the left eye 4 (four) times daily.         Marland Kitchen PREDNISOLONE ACETATE 1 % OP SUSP   Left Eye   Place 1 drop into the left eye 4 (four) times daily.         Marland Kitchen VITAMIN D (ERGOCALCIFEROL) 50000 UNITS PO CAPS   Oral   Take 50,000 Units by mouth 2 (two) times a week. Take on mondays and fridays.           BP 148/81  Pulse 90  Temp 98.6 F (37 C) (Oral)  Resp 20  SpO2 98%  LMP 05/18/2012  Physical Exam  Nursing note and vitals reviewed. Constitutional: Vital signs are normal. She appears well-developed and well-nourished.  Non-toxic appearance. She does not have a sickly appearance. She does not appear ill. No distress.  Musculoskeletal: She exhibits tenderness.       Left knee: She exhibits normal range of motion, no swelling, no effusion, no ecchymosis, no deformity, no laceration, no erythema, normal alignment, no LCL laxity, normal patellar mobility, normal meniscus and no MCL laxity. tenderness found. Medial joint line tenderness noted. No lateral joint line, no MCL, no LCL and no patellar tendon tenderness noted.       Legs: Neurological: She is alert.  Skin: No rash noted. No erythema.    ED Course  Procedures (including critical care time)  Labs Reviewed - No data to display Dg Knee Complete 4 Views Left  06/17/2012  *RADIOLOGY REPORT*  Clinical Data: Left knee injury/pain  LEFT KNEE - COMPLETE 4+ VIEW  Comparison: None.  Findings: No fracture or dislocation is seen.  The joint spaces are essentially preserved.  Minimal patellofemoral osteophytosis.  Possible tiny well corticated loose  body within the medial joint space.  Small suprapatellar knee joint effusion.  IMPRESSION: No fracture or dislocation is seen.  Small suprapatellar knee joint effusion.  Possible tiny loose body within the medial joint space.  Original Report Authenticated By: Charline Bills, M.D.      1. Derangement, knee internal       MDM  Knee sprain/strain with suspicion for medial meniscal or ligament mild tear- sprain. Patient was placed in a knee immobilizer by and crutches for 72 hours- RICE measures, also instructed and referred to see an orthopedic Dr. if weight bearing pain after 7-10 days. Patient understood and agreed with treatment plan and follow-up care as necessary.        Jimmie Molly, MD 06/17/12 [phone]

## 2012-07-02 ENCOUNTER — Ambulatory Visit (HOSPITAL_COMMUNITY)
Admission: RE | Admit: 2012-07-02 | Discharge: 2012-07-02 | Disposition: A | Discharge status: Home / Self Care | Payer: 59 | Source: Ambulatory Visit | Attending: Orthopaedic Surgery | Admitting: Orthopaedic Surgery

## 2012-07-02 DIAGNOSIS — IMO0001 Reserved for inherently not codable concepts without codable children: Secondary | ICD-10-CM | POA: Diagnosis not present

## 2012-07-02 DIAGNOSIS — R262 Difficulty in walking, not elsewhere classified: Secondary | ICD-10-CM | POA: Diagnosis not present

## 2012-07-02 DIAGNOSIS — M25569 Pain in unspecified knee: Secondary | ICD-10-CM | POA: Diagnosis not present

## 2012-07-02 DIAGNOSIS — M6281 Muscle weakness (generalized): Secondary | ICD-10-CM | POA: Diagnosis not present

## 2012-07-02 NOTE — Evaluation (Signed)
Physical Therapy Evaluation  Patient Details  Name: Connie Aguilar MRN: [id number] Date of Birth: 1976-03-31  Today's Date: 07/02/2012 Time: 4098-1191 PT Time Calculation (min): 48 min  Visit#: 1  of 8   Re-eval: 08/01/12 Assessment Diagnosis: chondromalacia Next MD Visit: 07/16/2012  Authorization: UMR/medicaid  Authorization Visit#: 1  of 8    Past Medical History:  Past Medical History  Diagnosis Date  . Diabetes mellitus   . GERD (gastroesophageal reflux disease)   . Hypertension   . Bronchitis     hx of as child  . PONV (postoperative nausea and vomiting)   . Pneumonia     hx of as child   Past Surgical History:  Past Surgical History  Procedure Date  . Wisdom tooth extraction   . Pars plana vitrectomy 09/13/2011    Procedure: PARS PLANA VITRECTOMY WITH 25 GAUGE;  Surgeon: Edmon Crape, MD;  Location: [facility] OR;  Service: Ophthalmology;  Laterality: Right;  . Pars plana vitrectomy 10/27/2011    Procedure: PARS PLANA VITRECTOMY WITH 25 GAUGE;  Surgeon: Edmon Crape, MD;  Location: [facility] OR;  Service: Ophthalmology;  Laterality: Right;  . Eye surgery march 2013, laser prp  OU    vitrectomy  . Cataract extraction w/phaco 11/27/2011    Procedure: CATARACT EXTRACTION PHACO AND INTRAOCULAR LENS PLACEMENT (IOC);  Surgeon: Gemma Payor, MD;  Location: [facility] ORS;  Service: Ophthalmology;  Laterality: Left;  CDE:3.02  . Pars plana vitrectomy 02/16/2012    Procedure: PARS PLANA VITRECTOMY WITH 25 GAUGE;  Surgeon: Edmon Crape, MD;  Location: [facility] OR;  Service: Ophthalmology;  Laterality: Left;  Repair Retinal Detachment  . Gas/fluid exchange 02/16/2012    Procedure: GAS/FLUID EXCHANGE;  Surgeon: Edmon Crape, MD;  Location: [facility] OR;  Service: Ophthalmology;  Laterality: Left;  SF6    Subjective Symptoms/Limitations Symptoms: [name] states that she has been having  L knee pain for a couple of months with no injury or trauma.  She has been referred to therapy to decrease her sx of pain. How  long can you sit comfortably?: Able to sit for prolong period of time How long can you stand comfortably?: She states she able to stand for a few minutes and then she has increased pain on the inner aspect of her knee.  How long can you walk comfortably?: The pt states she will have pain after walking just a few minutes.  She states the longer she walks the more it hurts. Pain Assessment Currently in Pain?: Yes Pain Score:   6 (current 0; worst 6) Pain Orientation: Left Pain Type: Chronic pain Pain Onset: More than a month ago Pain Frequency: Intermittent Pain Relieving Factors: resting; IP,  Effect of Pain on Daily Activities: increases    Prior Functioning  Home Living Home Access: Stairs to enter Entrance Stairs-Number of Steps: 1 Home Layout: One level Prior Function Driving: Yes Vocation: Student Vocation Requirements:  (on feet 80% of the [place]) Leisure: Hobbies-no  Cognition/Observation Cognition Overall Cognitive Status: Appears within functional limits for tasks assessed  Assessment LLE AROM (degrees) Left Knee Extension: 5  Left Knee Flexion: 118  LLE Strength Left Hip Flexion:  (4+/5) Left Hip Extension: 4/5 Left Hip ABduction: 5/5 Left Hip ADduction:  (4+/5) Left Knee Flexion: 4/5 Left Knee Extension: 4/5 Left Ankle Dorsiflexion: 4/5 Palpation Palpation: swelling in medial aspect of knee.  Exercise/Treatments    Stretches Lobbyist: 3 reps;30 seconds   Seated Long Arc Quad: 10 reps Other Seated  Knee Exercises: DF/PF x 10 Supine Quad Sets: 10 reps Straight Leg Raise with External Rotation: 10 reps Sidelying Hip ADduction: 10 reps Prone  Hamstring Curl: 10 reps;Limitations Hamstring Curl Limitations: 5# Hip Extension: 10 reps    Physical Therapy Assessment and Plan PT Assessment and Plan Clinical Impression Statement: Pt with decreased strength; tight quads who has been experiencing knee pain for the past several months which is progressive  in nature.  Pt will benefit from skilled therapy to decrease symptoms and improve functional tolerance Pt will benefit from skilled therapeutic intervention in order to improve on the following deficits: Difficulty walking;Decreased strength;Impaired flexibility Rehab Potential: Good PT Frequency: Min 2X/week PT Duration: 4 weeks PT Treatment/Interventions: Therapeutic exercise;Manual techniques;Modalities;Therapeutic activities PT Plan: begin rockerboard; lateral/forward step-ups; standing and supine terminal extension may add Ultrasound if pain has not decreased.    Goals Home Exercise Program Pt will Perform Home Exercise Program: Independently PT Short Term Goals Time to Complete Short Term Goals: 2 weeks PT Short Term Goal 1: mm strength increased 1/2 grade to be able to come sit to stand without difficulty PT Short Term Goal 2: Pt pain to be no greater than a 3/10 PT Short Term Goal 3: Pt to be able to walk for one hour without pain. PT Long Term Goals Time to Complete Long Term Goals: 4 weeks PT Long Term Goal 1: I in advance HEP PT Long Term Goal 2: Pt to be able to walk for two hours without increased pain Long Term Goal 3: Pt strength to be increased one grade to allow the above goals to be met.  Problem List Patient Active Problem List  Diagnosis  . Difficulty in walking    PT - End of Session Activity Tolerance: Patient tolerated treatment well General Behavior During Session: [facility] for tasks performed Cognition: [facility] for tasks performed PT Plan of Care PT Home Exercise Plan: given  GP    Heavenleigh Aguilar,Connie 07/02/2012, 4:27 PM  Physician Documentation Your signature is required to indicate approval of the treatment plan as stated above.  Please sign and either send electronically or make a copy of this report for your files and return this physician signed original.   Please mark one 1.__approve of plan  2. ___approve of plan with the following  conditions.   ______________________________                                                          _____________________ Physician Signature                                                                                                             Date

## 2012-07-04 ENCOUNTER — Ambulatory Visit (HOSPITAL_COMMUNITY)
Admission: RE | Admit: 2012-07-04 | Discharge: 2012-07-04 | Disposition: A | Discharge status: Home / Self Care | Payer: 59 | Source: Ambulatory Visit | Attending: Orthopaedic Surgery | Admitting: Orthopaedic Surgery

## 2012-07-04 DIAGNOSIS — IMO0001 Reserved for inherently not codable concepts without codable children: Secondary | ICD-10-CM | POA: Diagnosis not present

## 2012-07-04 NOTE — Progress Notes (Signed)
Physical Therapy Treatment Patient Details  Name: Connie Aguilar MRN: [id number] Date of Birth: 1976/02/28  Today's Date: 07/04/2012 Time: 4098-1191 PT Time Calculation (min): 41 min  Visit#: 2  of 8   Re-eval: 08/01/12 Charges: Therex x 30' Taping x 8'   Authorization: UMR/medicaid  Authorization Visit#: 2  of 8    Subjective: Symptoms/Limitations Symptoms: Pt reports HEP compliance.  Pain Assessment Currently in Pain?: Yes Pain Score:   4 Pain Location: Knee Pain Orientation: Left   Exercise/Treatments Standing Heel Raises: 10 reps;Limitations Heel Raises Limitations: Toe raises x 10 Terminal Knee Extension: 10 reps;Theraband Theraband Level (Terminal Knee Extension): Level 4 (Blue) Lateral Step Up: 10 reps;Left;Step Height: 4";Hand Hold: 2 Forward Step Up: 10 reps;Step Height: 4";Hand Hold: Haematologist Board: 2 minutes Seated Long Arc Quad: 15 reps   Manual Therapy Manual Therapy: Other (comment) Other Manual Therapy: Kinesio tape applied to correct L lateral tracking patella  Physical Therapy Assessment and Plan PT Assessment and Plan Clinical Impression Statement: Pt completes therex well after initial demo and cueing for technique. Visible fatigue noted in LLE with therex. L lateral tracking patella noted. Kinesio tape applied to correct this. Pt reports pain decrease to 2/10 at end of session. PT Plan: Continue to progress per PT POC. Assess reaction to kinesio tape next session.      Problem List Patient Active Problem List  Diagnosis  . Difficulty in walking    PT - End of Session Activity Tolerance: Patient tolerated treatment well General Behavior During Session: [facility] for tasks performed Cognition: [facility] for tasks performed  Seth Bake, PTA 07/04/2012, 5:23 PM

## 2012-07-09 ENCOUNTER — Ambulatory Visit (HOSPITAL_COMMUNITY): Payer: 59 | Admitting: Physical Therapy

## 2012-07-11 ENCOUNTER — Ambulatory Visit (HOSPITAL_COMMUNITY)
Admission: RE | Admit: 2012-07-11 | Discharge: 2012-07-11 | Disposition: A | Discharge status: Home / Self Care | Payer: 59 | Source: Ambulatory Visit | Attending: *Deleted | Admitting: *Deleted

## 2012-07-11 DIAGNOSIS — IMO0001 Reserved for inherently not codable concepts without codable children: Secondary | ICD-10-CM | POA: Diagnosis not present

## 2012-07-11 NOTE — Progress Notes (Signed)
Physical Therapy Treatment Patient Details  Name: Connie Aguilar MRN: [id number] Date of Birth: Oct 25, 1975  Today's Date: 07/11/2012 Time: 4098-1191 PT Time Calculation (min): 40 min  Visit#: 4  of 8   Re-eval: 08/01/12 Charges: Therex x 30' Ice x 10'  Authorization: UMR/medicaid  Authorization Visit#: 4  of 8    Subjective: Symptoms/Limitations Symptoms: Pt states that tape did not help. Pain Assessment Currently in Pain?: Yes Pain Score:   4 Pain Location: Knee Pain Orientation: Left   Exercise/Treatments Machines for Strengthening Cybex Knee Extension: 1pl x 10 LLE only Cybex Knee Flexion: 3pl x 10 LLE only Standing Heel Raises: 10 reps;Limitations Heel Raises Limitations: Toe raises x 10 Terminal Knee Extension: 10 reps;Theraband Theraband Level (Terminal Knee Extension): Level 4 (Blue) Lateral Step Up: 10 reps;Left;Step Height: 4";Hand Hold: 2 Forward Step Up: 10 reps;Step Height: 4";Hand Hold: 0;Left Step Down: 10 reps;Hand Hold: 1;Left;Step Height: 4" Functional Squat: 10 reps Rocker Board: 2 minutes   Modalities Modalities: Cryotherapy Cryotherapy Number Minutes Cryotherapy: 10 Minutes Cryotherapy Location: Knee (Left) Type of Cryotherapy: Ice pack  Physical Therapy Assessment and Plan PT Assessment and Plan Clinical Impression Statement: Tx limited by time as pt was late for appt. Taping not completed this session as pt had no change in pain with last tx. Pt completes therex with improved control. Ice applied to L knee at end of session. Pt reports pain decrease to 2/10 at end of session. PT Plan: Continue to progress per PT POC. Assess need for [place] next session.    Goals    Problem List Patient Active Problem List  Diagnosis  . Difficulty in walking    PT - End of Session Activity Tolerance: Patient tolerated treatment well General Behavior During Session: [facility] for tasks performed Cognition: [facility] for tasks performed  GP    Antonieta Iba 07/11/2012, 5:39 PM

## 2012-07-16 ENCOUNTER — Ambulatory Visit (HOSPITAL_COMMUNITY): Payer: 59 | Admitting: Physical Therapy

## 2012-07-18 ENCOUNTER — Ambulatory Visit (HOSPITAL_COMMUNITY)
Admission: RE | Admit: 2012-07-18 | Discharge: 2012-07-18 | Disposition: A | Discharge status: Home / Self Care | Payer: 59 | Source: Ambulatory Visit | Attending: Orthopaedic Surgery | Admitting: Orthopaedic Surgery

## 2012-07-18 DIAGNOSIS — IMO0001 Reserved for inherently not codable concepts without codable children: Secondary | ICD-10-CM | POA: Diagnosis not present

## 2012-07-18 NOTE — Progress Notes (Signed)
Physical Therapy Treatment Patient Details  Name: Connie Aguilar MRN: [id number] Date of Birth: Jul 03, 1975  Today's Date: 07/18/2012 Time: 4098-1191 PT Time Calculation (min): 40 min  Visit#: 4  of 8   Re-eval: 08/01/12 Charges: Therex x 38'  Authorization: UMR/medicaid  Authorization Visit#: 4  of 8    Subjective: Symptoms/Limitations Symptoms: Pt states that she is pain free because she has not worked the past few days. Pain Assessment Currently in Pain?: No/denies   Exercise/Treatments Aerobic Elliptical: 5'@L2  Machines for Strengthening Cybex Knee Extension: 1pl x 15 LLE only Cybex Knee Flexion: 3pl x 15 LLE only Standing Lateral Step Up: 15 reps;Step Height: 4";Left;Hand Hold: 2 Forward Step Up: 15 reps;Step Height: 4";Left Step Down: 15 reps;Hand Hold: 1;Left;Step Height: 4" Functional Squat: 10 reps Rocker Board: 2 minutes SLS: L:6" max of 5  Physical Therapy Assessment and Plan PT Assessment and Plan Clinical Impression Statement: Pt is pain free throughout tx. Began stair training. Pt uses reciprocal pattern when ascending stairs and step-to pattern with descent. Pt is able to use reciprocal pattern on descent after cueing to do so. Also began elliptical to improve quad strength and activity tolerance. Pt reports 0/10 pain at end of session. PT Plan: Continue to progress knee strength/stability per PT POC.     Problem List Patient Active Problem List  Diagnosis  . Difficulty in walking    PT - End of Session Activity Tolerance: Patient tolerated treatment well General Behavior During Session: [facility] for tasks performed Cognition: [facility] for tasks performed   Seth Bake, PTA 07/18/2012, 4:56 PM

## 2012-07-23 ENCOUNTER — Ambulatory Visit (HOSPITAL_COMMUNITY)
Admission: RE | Admit: 2012-07-23 | Discharge: 2012-07-23 | Disposition: A | Discharge status: Home / Self Care | Payer: 59 | Source: Ambulatory Visit | Attending: Orthopaedic Surgery | Admitting: Orthopaedic Surgery

## 2012-07-23 DIAGNOSIS — R262 Difficulty in walking, not elsewhere classified: Secondary | ICD-10-CM | POA: Insufficient documentation

## 2012-07-23 DIAGNOSIS — M25569 Pain in unspecified knee: Secondary | ICD-10-CM | POA: Insufficient documentation

## 2012-07-23 DIAGNOSIS — M6281 Muscle weakness (generalized): Secondary | ICD-10-CM | POA: Insufficient documentation

## 2012-07-23 DIAGNOSIS — IMO0001 Reserved for inherently not codable concepts without codable children: Secondary | ICD-10-CM | POA: Insufficient documentation

## 2012-07-23 NOTE — Progress Notes (Signed)
Physical Therapy Treatment Patient Details  Name: Connie Aguilar MRN: [id number] Date of Birth: [birth year]  Today's Date: 07/23/2012 Time: 4782-9562 PT Time Calculation (min): 40 min  Visit#: 5  of 8   Re-eval: 08/01/12 Authorization: [employer]/medicaid  Authorization Visit#: 5  of 8   Charges:  therex 38'  Subjective: Symptoms/Limitations Symptoms: Pt. states her knee hurt really bad earlier, up to 8/10 but took pain meds and brought it down to zero.  Pt. comes to therapy today with wedge heels; reports they do not increase her pain. Pain Assessment Currently in Pain?: No/denies   Exercise/Treatments Aerobic Elliptical: 5'@L2  Machines for Strengthening Cybex Knee Extension: 1pl x 15 LLE only Cybex Knee Flexion: 3pl x 15 LLE only Standing Heel Raises: 15 reps Heel Raises Limitations: Toe raises x 15 Terminal Knee Extension: 10 reps;Theraband Theraband Level (Terminal Knee Extension): Level 4 (Blue) Lateral Step Up: 15 reps;Step Height: 4";Left;Hand Hold: 2 Forward Step Up: 15 reps;Step Height: 4";Left Step Down: 15 reps;Hand Hold: 1;Left;Step Height: 4" Functional Squat: 15 reps Wall Squat: 10 reps;5 seconds Rocker Board: 2 minutes SLS: L:7" max of 3 SLS with Vectors: 5 X 5" L LE     Physical Therapy Assessment and Plan PT Assessment and Plan Clinical Impression Statement: Added vector stance and wall squats to increase strength and stability.  Noted instability in Quads noted with 5 second holds.  Able to complete all exercises without complaint of pain.  Ambulated without discomfort in wedge heels and completed all required tasks while wearing wedge heels. PT Plan: Continue to progress knee strength/stability per PT POC.  Increase weights with cybex machines.  Re-evaluate next visit.     Problem List Patient Active Problem List  Diagnosis  . Difficulty in walking    PT - End of Session Activity Tolerance: Patient tolerated treatment well General Behavior During  Session: [facility] for tasks performed Cognition: [facility] for tasks performed   Lurena Nida, PTA/CLT 07/23/2012, 4:54 PM

## 2012-07-25 ENCOUNTER — Inpatient Hospital Stay (HOSPITAL_COMMUNITY): Admission: RE | Admit: 2012-07-25 | Payer: 59 | Source: Ambulatory Visit | Admitting: *Deleted

## 2012-09-09 ENCOUNTER — Telehealth: Payer: Self-pay | Admitting: *Deleted

## 2012-09-09 NOTE — Telephone Encounter (Signed)
Pt wants to speak with Victorino Dike. Would not give me any info.

## 2012-09-09 NOTE — Telephone Encounter (Signed)
Pt. Wanted to know if Rx. Needed for Plan B, I told her no it was available  At the drug store without prescription and it was about 40 dollars and needed to be taken within 3 days of unprotected sex or condom broke. She was to discuss birth control so she is going to call back for an appointment to dicuss.

## 2012-09-18 ENCOUNTER — Encounter: Payer: Self-pay | Admitting: *Deleted

## 2012-09-19 ENCOUNTER — Encounter: Payer: Self-pay | Admitting: Adult Health

## 2012-09-19 ENCOUNTER — Ambulatory Visit (INDEPENDENT_AMBULATORY_CARE_PROVIDER_SITE_OTHER): Payer: 59 | Admitting: Adult Health

## 2012-09-19 VITALS — BP 138/86 | HR 80 | Ht 61.0 in | Wt 220.0 lb

## 2012-09-19 DIAGNOSIS — Z309 Encounter for contraceptive management, unspecified: Secondary | ICD-10-CM

## 2012-09-19 DIAGNOSIS — E119 Type 2 diabetes mellitus without complications: Secondary | ICD-10-CM

## 2012-09-19 DIAGNOSIS — I10 Essential (primary) hypertension: Secondary | ICD-10-CM

## 2012-09-19 DIAGNOSIS — IMO0001 Reserved for inherently not codable concepts without codable children: Secondary | ICD-10-CM

## 2012-09-19 DIAGNOSIS — J329 Chronic sinusitis, unspecified: Secondary | ICD-10-CM

## 2012-09-19 DIAGNOSIS — Z3202 Encounter for pregnancy test, result negative: Secondary | ICD-10-CM

## 2012-09-19 HISTORY — DX: Encounter for contraceptive management, unspecified: Z30.9

## 2012-09-19 HISTORY — DX: Type 2 diabetes mellitus without complications: E11.9

## 2012-09-19 LAB — POCT URINE PREGNANCY: Preg Test, Ur: NEGATIVE

## 2012-09-19 MED ORDER — NORETHINDRONE 0.35 MG PO TABS: 1.0000 | ORAL_TABLET | Freq: Every day | ORAL | Status: DC

## 2012-09-19 MED ORDER — AZITHROMYCIN 250 MG PO TABS: ORAL_TABLET | ORAL | Status: DC

## 2012-09-19 NOTE — Progress Notes (Signed)
Subjective:     Patient ID: Connie Aguilar, female   DOB: 1976-05-16, 37 y.o.   MRN: [id number]  HPI Connie Aguilar is a 37 year old black female in today to talk about getting on birth control and also she is complaining of what sounds like a sinus infection.   Review of SystemsShe has pain and pressure at left eye and a cough that is non productive. She denies any fever or sore throat.She says she wants to try birth control pills after we discussed her options.     Objective:   Physical Exam Skin is warm and dry, cardiovascular: regular rate and rhythm lungs are clear to auscultation bilaterally. No frontal or ethmoid tenderness, she has pressure over the left eye. No swelling or purulence on tonsils and no cervical adenopathy.     Assessment:      Contraceptive management Sinus infection Diabetes Hypertension     Plan:      Start Micronor today, use condoms for 2 to 4 weeks Take azithromycin and increase fluids, try zyrtec otc.   Return to clinic in 2 weeks to check blood pressure

## 2012-09-19 NOTE — Patient Instructions (Addendum)
Take the zpack, increase fluids, try zyrtec.Start the [employer] today and use condoms x 2 to 4 weeks. Follow up in 2 weeks for BP check sign up for my chart

## 2012-10-03 ENCOUNTER — Ambulatory Visit (INDEPENDENT_AMBULATORY_CARE_PROVIDER_SITE_OTHER): Payer: 59 | Admitting: Adult Health

## 2012-10-03 ENCOUNTER — Encounter: Payer: Self-pay | Admitting: Adult Health

## 2012-10-03 VITALS — BP 112/76 | Ht 61.0 in | Wt 220.0 lb

## 2012-10-03 DIAGNOSIS — E119 Type 2 diabetes mellitus without complications: Secondary | ICD-10-CM

## 2012-10-03 DIAGNOSIS — Z309 Encounter for contraceptive management, unspecified: Secondary | ICD-10-CM

## 2012-10-03 DIAGNOSIS — I10 Essential (primary) hypertension: Secondary | ICD-10-CM

## 2012-10-03 NOTE — Patient Instructions (Addendum)
Continue micronor  Call any problems

## 2012-10-03 NOTE — Progress Notes (Signed)
Subjective:     Patient ID: Connie Aguilar, female   DOB: 1975/07/02, 37 y.o.   MRN: [id number]  HPI  Connie Aguilar is a 37 year old black female in today in follow up of starting micronor, and check her BP.  Review of Systems She says she feels better just tired today. The sinus infection resolved with the  Z Pak and she has spotted some with the micronor. No other complaints. Reviewed past medical, surgical, social and family history. Reviewed medications and allergies.    Objective:   Physical Exam Blood pressure 112/76, height 5\' 1"  (1.549 m), weight 220 lb (99.791 kg), last menstrual period 09/16/2012.  Skin warm and dry. Alert and cooperative. No exam today just talked      Assessment:     Contraceptive management Hypertension Diabetes    Plan:      Continue micronor Call prn problems

## 2012-12-10 ENCOUNTER — Encounter: Payer: Self-pay | Admitting: *Deleted

## 2012-12-10 ENCOUNTER — Encounter: Payer: 59 | Attending: Internal Medicine | Admitting: *Deleted

## 2012-12-10 VITALS — Ht 61.0 in | Wt 221.3 lb

## 2012-12-10 DIAGNOSIS — Z713 Dietary counseling and surveillance: Secondary | ICD-10-CM | POA: Insufficient documentation

## 2012-12-10 DIAGNOSIS — E119 Type 2 diabetes mellitus without complications: Secondary | ICD-10-CM | POA: Insufficient documentation

## 2012-12-10 NOTE — Patient Instructions (Signed)
Plan:  Aim for 3 Carb Choices per meal (45 grams) +/- 1 either way  Aim for 0-1 Carbs per snack if hungry Consider adding some vegetables at lunch and supper to add more high quality foods to your plate  Consider reading food labels for Total Carbohydrate of foods Continue checking BG at alternate times per [place] as directed by MD Consider options for types of insulin  and discuss with MD

## 2012-12-10 NOTE — Progress Notes (Signed)
  Medical Nutrition Therapy:  Appt start time: 1000 end time:  1100.  Assessment:  Primary concerns today: patient here for diabetes education including carb counting and insulin action information. Lives with 35 year old daughter, Destiny. She shops and prepares meals in the home. SMBG twice daily, both pre and post meals typically, stated range 101-260 mg/dl. States no hypoglycemia lately. Likes to shop and enjoys quiet time. 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 Works 12 hour shifts from 7 AM to 7 PM, 3 days a week.  MEDICATIONS: see list. Insulin dose is 40 units Humalog 75/25 in AM and 30 units Humalog 50/50 with supper meal   DIETARY INTAKE:  Usual eating pattern includes 3 meals and 0 snacks per Ricchio.  Everyday foods include variety of most food groups.  Avoided foods include none stated.    24-hr recall:  B ( AM): [employer] fried chicken biscuit OR grits with cheese, bacon x 2, water  Snk ( AM): none  L ( PM): brings sandwich from home OR soup and grilled cheese from cafeteria, water Snk ( PM): PNB crackers D ( PM): when gets home - easily prepared meals; frozen dinners often, water Snk ( PM): none Beverages: water  Usual physical activity: none  Estimated energy needs: 1400 calories 158 g carbohydrates 105 g protein 39 g fat  Progress Towards Goal(s):  In progress.   Nutritional Diagnosis:  NB-1.1 Food and nutrition-related knowledge deficit As related to diabetes.  As evidenced by A1c of 10.2% in May, 2014.    Intervention:  Nutrition counseling and diabetes education initiated. Discussed basic physiology of diabetes, SMBG and rationale of checking BG at alternate times of Ruggerio, A1c, Carb Counting and reading food labels, and benefits of increased activity. Also discussed insulin action and specific peak times of both mixed insulins, showing peak times at meal time and intermediate action times. She expressed good verbal understanding of peak times  and value of consistent carb intake to improve BG variability. Also informed her of potential option of intensive insulin regime and value of empowering her to adjust single action insulins to match food intake in the future, if OK with MD.  Plan:  Aim for 3 Carb Choices per meal (45 grams) +/- 1 either way  Aim for 0-1 Carbs per snack if hungry Consider adding some vegetables at lunch and supper to add more high quality foods to your plate  Consider reading food labels for Total Carbohydrate of foods Continue checking BG at alternate times per Hur as directed by MD Consider options for types of insulin  and discuss with MD   Handouts given during visit include: Living Well with Diabetes Carb Counting and Food Label handouts Meal Plan Card  Insulin Action handout  Monitoring/Evaluation:  Dietary intake, exercise, SMBG, taking insulin at consistent times of [place], and body weight in 6 week(s). She is working with [facility] RN, [place] Mon and has appt with her in 2 weeks, so plan follow up with me 4 weeks after that.

## 2012-12-17 ENCOUNTER — Telehealth: Payer: Self-pay | Admitting: Adult Health

## 2012-12-17 NOTE — Telephone Encounter (Signed)
Left message to continue Micronor for now or can make appt if needed to discuss other options.

## 2012-12-17 NOTE — Telephone Encounter (Signed)
Spoke with pt. Is having a period every 2 weeks. Is on Micronor and has been for 2 months. Pt states she takes med at the same time every [place], and has not missed any pills. No new sexual partner. Advised this can be normal when starting a new birth control, but will send note to [facility] for further recommendations. JSY

## 2013-01-21 ENCOUNTER — Ambulatory Visit: Payer: 59 | Admitting: *Deleted

## 2013-02-03 ENCOUNTER — Ambulatory Visit: Payer: 59 | Admitting: *Deleted

## 2013-03-05 ENCOUNTER — Ambulatory Visit: Payer: 59 | Admitting: *Deleted

## 2013-05-03 ENCOUNTER — Other Ambulatory Visit: Payer: Self-pay | Admitting: Adult Health

## 2013-05-19 ENCOUNTER — Telehealth: Payer: Self-pay

## 2013-05-19 NOTE — Telephone Encounter (Signed)
Has itching and bleeding to come in at 4 pm 12/2

## 2013-05-19 NOTE — Telephone Encounter (Signed)
Left message I called,call me back.  

## 2013-05-20 ENCOUNTER — Ambulatory Visit (INDEPENDENT_AMBULATORY_CARE_PROVIDER_SITE_OTHER): Payer: 59 | Admitting: Adult Health

## 2013-05-20 ENCOUNTER — Encounter: Payer: Self-pay | Admitting: Adult Health

## 2013-05-20 VITALS — BP 130/84 | Ht 61.0 in | Wt 215.0 lb

## 2013-05-20 DIAGNOSIS — A499 Bacterial infection, unspecified: Secondary | ICD-10-CM

## 2013-05-20 DIAGNOSIS — N898 Other specified noninflammatory disorders of vagina: Secondary | ICD-10-CM

## 2013-05-20 DIAGNOSIS — L293 Anogenital pruritus, unspecified: Secondary | ICD-10-CM

## 2013-05-20 DIAGNOSIS — N760 Acute vaginitis: Secondary | ICD-10-CM

## 2013-05-20 DIAGNOSIS — B9689 Other specified bacterial agents as the cause of diseases classified elsewhere: Secondary | ICD-10-CM

## 2013-05-20 HISTORY — DX: Other specified noninflammatory disorders of vagina: N89.8

## 2013-05-20 LAB — POCT WET PREP (WET MOUNT): WBC, Wet Prep HPF POC: POSITIVE

## 2013-05-20 MED ORDER — METRONIDAZOLE 0.75 % VA GEL: VAGINAL | Status: DC

## 2013-05-20 MED ORDER — FLUCONAZOLE 150 MG PO TABS: ORAL_TABLET | ORAL | Status: DC

## 2013-05-20 NOTE — Patient Instructions (Signed)
Bacterial Vaginosis Bacterial vaginosis (BV) is a vaginal infection where the normal balance of bacteria in the vagina is disrupted. The normal balance is then replaced by an overgrowth of certain bacteria. There are several different kinds of bacteria that can cause BV. BV is the most common vaginal infection in women of childbearing age. CAUSES   The cause of BV is not fully understood. BV develops when there is an increase or imbalance of harmful bacteria.  Some activities or behaviors can upset the normal balance of bacteria in the vagina and put women at increased risk including:  Having a new sex partner or multiple sex partners.  Douching.  Using an intrauterine device (IUD) for contraception.  It is not clear what role sexual activity plays in the development of BV. However, women that have never had sexual intercourse are rarely infected with BV. Women do not get BV from toilet seats, bedding, swimming pools or from touching objects around them.  SYMPTOMS   Grey vaginal discharge.  A fish-like odor with discharge, especially after sexual intercourse.  Itching or burning of the vagina and vulva.  Burning or pain with urination.  Some women have no signs or symptoms at all. DIAGNOSIS  Your caregiver must examine the vagina for signs of BV. Your caregiver will perform lab tests and look at the sample of vaginal fluid through a microscope. They will look for bacteria and abnormal cells (clue cells), a pH test higher than 4.5, and a positive amine test all associated with BV.  RISKS AND COMPLICATIONS   Pelvic inflammatory disease (PID).  Infections following gynecology surgery.  Developing HIV.  Developing herpes virus. TREATMENT  Sometimes BV will clear up without treatment. However, all women with symptoms of BV should be treated to avoid complications, especially if gynecology surgery is planned. Female partners generally do not need to be treated. However, BV may spread  between female sex partners so treatment is helpful in preventing a recurrence of BV.   BV may be treated with antibiotics. The antibiotics come in either pill or vaginal cream forms. Either can be used with nonpregnant or pregnant women, but the recommended dosages differ. These antibiotics are not harmful to the baby.  BV can recur after treatment. If this happens, a second round of antibiotics will often be prescribed.  Treatment is important for pregnant women. If not treated, BV can cause a premature delivery, especially for a pregnant woman who had a premature birth in the past. All pregnant women who have symptoms of BV should be checked and treated.  For chronic reoccurrence of BV, treatment with a type of prescribed gel vaginally twice a week is helpful. HOME CARE INSTRUCTIONS   Finish all medication as directed by your caregiver.  Do not have sex until treatment is completed.  Tell your sexual partner that you have a vaginal infection. They should see their caregiver and be treated if they have problems, such as a mild rash or itching.  Practice safe sex. Use condoms. Only have 1 sex partner. PREVENTION  Basic prevention steps can help reduce the risk of upsetting the natural balance of bacteria in the vagina and developing BV:  Do not have sexual intercourse (be abstinent).  Do not douche.  Use all of the medicine prescribed for treatment of BV, even if the signs and symptoms go away.  Tell your sex partner if you have BV. That way, they can be treated, if needed, to prevent reoccurrence. SEEK MEDICAL CARE IF:     Your symptoms are not improving after 3 days of treatment.  You have increased discharge, pain, or fever. MAKE SURE YOU:   Understand these instructions.  Will watch your condition.  Will get help right away if you are not doing well or get worse. FOR MORE INFORMATION  Division of STD Prevention (DSTDP), Centers for Disease Control and Prevention:  SolutionApps.co.za American Social Health Association (ASHA): www.ashastd.org  Document Released: 06/05/2005 Document Revised: 08/28/2011 Document Reviewed: 01/15/2013 [facility] Patient Information 2014 [place], [place]. Use metrogel and then 1 diflucan before menses

## 2013-05-20 NOTE — Progress Notes (Signed)
Subjective:     Patient ID: Connie Aguilar, female   DOB: Feb 12, 1976, 37 y.o.   MRN: [id number]  HPI Connie Aguilar is a 37 year old black female in complaining of vaginal discharge and itch and has had spotting on the micronor.The itching is more at the clitoral area.  Review of Systems See HPI Reviewed past medical,surgical, social and family history. Reviewed medications and allergies.     Objective:   Physical Exam BP 130/84  Ht 5\' 1"  (1.549 m)  Wt 215 lb (97.523 kg)  BMI 40.64 kg/m2  LMP 05/15/2013   Skin warm and dry.Pelvic: external genitalia is normal in appearance, vagina: pnkish discharge with odor, cervix:smooth, uterus: normal size, shape and contour, non tender, no masses felt, adnexa: no masses or tenderness noted. Wet prep: + for clue cells and +WBCs. GC/CHL obtained.  Assessment:     Vaginal discharge  Vaginal itch BV    Plan:     Rx Metrogel at hs x 5 nights Rx diflucan 150 mg 1 before menses #6 with 2 refills Review handout on BV   GC/CHL sent, call in am for results

## 2013-05-21 ENCOUNTER — Telehealth: Payer: Self-pay | Admitting: Adult Health

## 2013-05-21 LAB — GC/CHLAMYDIA PROBE AMP: CT Probe RNA: NEGATIVE

## 2013-05-21 NOTE — Telephone Encounter (Signed)
Left message labs negative 

## 2013-05-30 ENCOUNTER — Telehealth: Payer: Self-pay | Admitting: *Deleted

## 2013-05-30 NOTE — Telephone Encounter (Signed)
She thinks she peed when having sex, but not sure, told her to void before sex and see if it happens again, may refer to urologist if it does.

## 2013-07-28 ENCOUNTER — Telehealth: Payer: Self-pay | Admitting: Adult Health

## 2013-07-28 MED ORDER — FLUCONAZOLE 150 MG PO TABS: ORAL_TABLET | ORAL | Status: DC

## 2013-07-28 NOTE — Addendum Note (Signed)
Addended by: Cyril MourningGRIFFIN, Chetan Mehring A on: 07/28/2013 03:16 PM   Modules accepted: Orders

## 2013-07-28 NOTE — Telephone Encounter (Signed)
Pt wants refill on diflucan has 2 but will resend

## 2013-07-28 NOTE — Telephone Encounter (Signed)
Left message to call me back.

## 2013-08-02 ENCOUNTER — Encounter (HOSPITAL_COMMUNITY): Payer: Self-pay | Admitting: Emergency Medicine

## 2013-08-02 ENCOUNTER — Emergency Department (HOSPITAL_COMMUNITY): Payer: 59

## 2013-08-02 ENCOUNTER — Emergency Department (HOSPITAL_COMMUNITY)
Admission: EM | Admit: 2013-08-02 | Discharge: 2013-08-02 | Disposition: A | Discharge status: Home / Self Care | Payer: 59 | Attending: Emergency Medicine | Admitting: Emergency Medicine

## 2013-08-02 DIAGNOSIS — Z8719 Personal history of other diseases of the digestive system: Secondary | ICD-10-CM | POA: Insufficient documentation

## 2013-08-02 DIAGNOSIS — J069 Acute upper respiratory infection, unspecified: Secondary | ICD-10-CM | POA: Insufficient documentation

## 2013-08-02 DIAGNOSIS — Z8701 Personal history of pneumonia (recurrent): Secondary | ICD-10-CM | POA: Insufficient documentation

## 2013-08-02 DIAGNOSIS — Z8742 Personal history of other diseases of the female genital tract: Secondary | ICD-10-CM | POA: Insufficient documentation

## 2013-08-02 DIAGNOSIS — E119 Type 2 diabetes mellitus without complications: Secondary | ICD-10-CM | POA: Insufficient documentation

## 2013-08-02 DIAGNOSIS — Z794 Long term (current) use of insulin: Secondary | ICD-10-CM | POA: Insufficient documentation

## 2013-08-02 DIAGNOSIS — I10 Essential (primary) hypertension: Secondary | ICD-10-CM | POA: Insufficient documentation

## 2013-08-02 DIAGNOSIS — F17200 Nicotine dependence, unspecified, uncomplicated: Secondary | ICD-10-CM | POA: Insufficient documentation

## 2013-08-02 DIAGNOSIS — Z880 Allergy status to penicillin: Secondary | ICD-10-CM | POA: Insufficient documentation

## 2013-08-02 DIAGNOSIS — Z792 Long term (current) use of antibiotics: Secondary | ICD-10-CM | POA: Insufficient documentation

## 2013-08-02 MED ORDER — BENZONATATE 100 MG PO CAPS
200.0000 mg | ORAL_CAPSULE | Freq: Once | ORAL | Status: AC
  Administered 2013-08-02: 200 mg via ORAL
  Filled 2013-08-02: qty 2

## 2013-08-02 MED ORDER — BENZONATATE 200 MG PO CAPS: 200.0000 mg | ORAL_CAPSULE | Freq: Three times a day (TID) | ORAL | Status: DC | PRN

## 2013-08-02 NOTE — Discharge Instructions (Signed)

## 2013-08-02 NOTE — ED Provider Notes (Signed)
Medical screening examination/treatment/procedure(s) were performed by non-physician practitioner and as supervising physician I was immediately available for consultation/collaboration.  EKG Interpretation   None        Donnetta HutchingBrian Enjoli Tidd, MD 08/02/13 1440

## 2013-08-02 NOTE — ED Provider Notes (Signed)
CSN: [id number]     Arrival date & time 08/02/13  1010 History   First MD Initiated Contact with Patient 08/02/13 1012     Chief Complaint  Patient presents with  . Cough     (Consider location/radiation/quality/duration/timing/severity/associated sxs/prior Treatment) HPI Comments: Connie Aguilar is a 38 y.o. Female presenting with a 1 [place]le history of uri type symptoms which includes nasal congestion with clear rhinorrhea, nonproductive cough and now sore throat and hoarse voice which started after her coughing, and low grade temperature of 99.5.   Symptoms due to not include shortness of breath, chest pain,  Nausea, vomiting or diarrhea.  The patient has taken otc cough syrup including guaifenesin and dextromethorphan  prior to arrival with transient improvement in symptoms.      The history is provided by the patient.    Past Medical History  Diagnosis Date  . Diabetes mellitus   . GERD (gastroesophageal reflux disease)   . Hypertension   . Bronchitis     hx of as child  . PONV (postoperative nausea and vomiting)   . Pneumonia     hx of as child  . BV (bacterial vaginosis)   . Diabetes 09/19/2012  . Contraception management 09/19/2012  . Vaginal discharge 05/20/2013  . Vaginal itching 05/20/2013   Past Surgical History  Procedure Laterality Date  . Wisdom tooth extraction    . Pars plana vitrectomy  09/13/2011    Procedure: PARS PLANA VITRECTOMY WITH 25 GAUGE;  Surgeon: Edmon Crape, MD;  Location: [facility] OR;  Service: Ophthalmology;  Laterality: Right;  . Pars plana vitrectomy  10/27/2011    Procedure: PARS PLANA VITRECTOMY WITH 25 GAUGE;  Surgeon: Edmon Crape, MD;  Location: [facility] OR;  Service: Ophthalmology;  Laterality: Right;  . Eye surgery  march 2013, laser prp  [facility]    vitrectomy  . Cataract extraction w/phaco  11/27/2011    Procedure: CATARACT EXTRACTION PHACO AND INTRAOCULAR LENS PLACEMENT (IOC);  Surgeon: Gemma Payor, MD;  Location: [facility] ORS;  Service: Ophthalmology;  Laterality:  Left;  CDE:3.02  . Pars plana vitrectomy  02/16/2012    Procedure: PARS PLANA VITRECTOMY WITH 25 GAUGE;  Surgeon: Edmon Crape, MD;  Location: [facility] OR;  Service: Ophthalmology;  Laterality: Left;  Repair Retinal Detachment  . Gas/fluid exchange  02/16/2012    Procedure: GAS/FLUID EXCHANGE;  Surgeon: Edmon Crape, MD;  Location: [facility] OR;  Service: Ophthalmology;  Laterality: Left;  SF6  . Cesarean section     Family History  Problem Relation Age of Onset  . Cancer Mother 23    breast  . Hypertension Mother   . Diabetes Father   . Diabetes Sister   . Anesthesia problems Neg Hx   . Hypotension Neg Hx   . Malignant hyperthermia Neg Hx   . Pseudochol deficiency Neg Hx   . Diabetes Maternal Grandmother   . Diabetes Maternal Grandfather   . Diabetes Paternal Grandmother   . Diabetes Paternal Grandfather   . Diabetes Sister   . Seizures Daughter    History  Substance Use Topics  . Smoking status: Current Some Ybarbo Smoker -- 0.50 packs/Marin for 15 years    Types: Cigarettes  . Smokeless tobacco: Never Used  . Alcohol Use: Yes     Comment: occ- <1 a week, wine, beer or mixed drink   OB History   Grav Para Term Preterm Abortions TAB SAB Ect Mult Living   1 1  1  1     Review of Systems  Constitutional: Negative for fever and chills.  HENT: Positive for congestion, rhinorrhea, sore throat and voice change. Negative for ear discharge, ear pain, facial swelling, sinus pressure and trouble swallowing.   Eyes: Negative for discharge.  Respiratory: Positive for cough. Negative for shortness of breath, wheezing and stridor.   Cardiovascular: Negative for chest pain.  Gastrointestinal: Negative for abdominal pain.  Genitourinary: Negative.       Allergies  Food and Penicillins  Home Medications   Current Outpatient Rx  Name  Route  Sig  Dispense  Refill  . acetaminophen (TYLENOL) 500 MG tablet   Oral   Take 1,000 mg by mouth every 6 (six) hours as needed. Pain         .  benzonatate (TESSALON) 200 MG capsule   Oral   Take 1 capsule (200 mg total) by mouth 3 (three) times daily as needed for cough.   20 capsule   0   . fluconazole (DIFLUCAN) 150 MG tablet      Take 1 monthly before menses   6 tablet   2   . HYDROcodone-ibuprofen (VICOPROFEN) 7.5-200 MG per tablet   Oral   Take 1 tablet by mouth every 8 (eight) hours as needed for pain.   15 tablet   0   . insulin lispro protamine-insulin lispro (HUMALOG 50/50) (50-50) 100 UNIT/ML SUSP   Subcutaneous   Inject 20 Units into the skin at bedtime.         . insulin lispro protamine-insulin lispro (HUMALOG 75/25) (75-25) 100 UNIT/ML SUSP   Subcutaneous   Inject 36 Units into the skin daily with breakfast.         . losartan (COZAAR) 50 MG tablet   Oral   Take 50 mg by mouth daily.         . metoprolol succinate (TOPROL-XL) 100 MG 24 hr tablet   Oral   Take 100 mg by mouth daily. Take with or immediately following a meal.         . metroNIDAZOLE (METROGEL) 0.75 % vaginal gel      INSERT 1 APPLICATORFUL VAGINALLY AT BEDTIME FOR 5 NIGHTS   70 g   1   . norethindrone (MICRONOR,CAMILA,ERRIN) 0.35 MG tablet   Oral   Take 1 tablet (0.35 mg total) by mouth daily.   1 Package   11   . PARoxetine (PAXIL) 20 MG tablet   Oral   Take 20 mg by mouth daily.          BP 142/75  Pulse 92  Temp(Src) 98.2 F (36.8 C) (Oral)  Resp 16  Ht 5\' 1"  (1.549 m)  Wt 215 lb (97.523 kg)  BMI 40.64 kg/m2  SpO2 97%  LMP 07/14/2013 Physical Exam  Constitutional: She is oriented to person, place, and time. She appears well-developed and well-nourished.  HENT:  Head: Normocephalic and atraumatic.  Right Ear: Tympanic membrane and ear canal normal.  Left Ear: Tympanic membrane and ear canal normal.  Nose: Mucosal edema and rhinorrhea present.  Mouth/Throat: Uvula is midline, oropharynx is clear and moist and mucous membranes are normal. No oropharyngeal exudate, posterior oropharyngeal edema,  posterior oropharyngeal erythema or tonsillar abscesses.  Voice is hoarse.    Eyes: Conjunctivae are normal.  Cardiovascular: Normal rate and normal heart sounds.   Pulmonary/Chest: Effort normal. No respiratory distress. She has no decreased breath sounds. She has no wheezes. She has rhonchi. She has no rales.  Few scattered rhonchi, clears with cough.  Abdominal: Soft. There is no tenderness.  Musculoskeletal: Normal range of motion.  Neurological: She is alert and oriented to person, place, and time.  Skin: Skin is warm and dry. No rash noted.  Psychiatric: She has a normal mood and affect.    ED Course  Procedures (including critical care time) Labs Review Labs Reviewed - No data to display Imaging Review Dg Chest 2 View  08/02/2013   CLINICAL DATA:  Cough and congestion.  EXAM: CHEST  2 VIEW  COMPARISON:  09/13/2011  FINDINGS: The heart size and mediastinal contours are within normal limits. Both lungs are clear. The visualized skeletal structures are unremarkable.  IMPRESSION: No active cardiopulmonary disease.   Electronically Signed   By: Elberta Fortisaniel  Boyle M.D.   On: 08/02/2013 11:17    EKG Interpretation   None       MDM   Final diagnoses:  Acute URI    Exam consistent with viral uri.  Will check cxr as pt is a smoker,  Few rhonchi on auscultation.  Tessalon ordered and will prescribe same.  Encouraged rest,  Increased fluid intake,  Warm salt gargles, prn f/u with pcp for worsened sx.  Patients labs and/or radiological studies were viewed and considered during the medical decision making and disposition process.     Burgess AmorJulie Neriyah Cercone, PA-C 08/02/13 1326

## 2013-08-02 NOTE — ED Notes (Signed)
Productive cough, yellow in color. Sore throat. Headache from coughing. Losing voice. Symptoms began 2 days ago.

## 2013-09-04 ENCOUNTER — Other Ambulatory Visit: Payer: Self-pay | Admitting: Adult Health

## 2013-11-06 ENCOUNTER — Encounter (HOSPITAL_COMMUNITY): Payer: Self-pay | Admitting: Emergency Medicine

## 2013-11-06 ENCOUNTER — Emergency Department (HOSPITAL_COMMUNITY): Payer: Medicaid Other

## 2013-11-06 ENCOUNTER — Emergency Department (HOSPITAL_COMMUNITY)
Admission: EM | Admit: 2013-11-06 | Discharge: 2013-11-06 | Disposition: A | Discharge status: Home / Self Care | Payer: Medicaid Other | Attending: Emergency Medicine | Admitting: Emergency Medicine

## 2013-11-06 DIAGNOSIS — Z3202 Encounter for pregnancy test, result negative: Secondary | ICD-10-CM | POA: Insufficient documentation

## 2013-11-06 DIAGNOSIS — Z794 Long term (current) use of insulin: Secondary | ICD-10-CM | POA: Insufficient documentation

## 2013-11-06 DIAGNOSIS — Z8719 Personal history of other diseases of the digestive system: Secondary | ICD-10-CM | POA: Insufficient documentation

## 2013-11-06 DIAGNOSIS — R209 Unspecified disturbances of skin sensation: Secondary | ICD-10-CM | POA: Insufficient documentation

## 2013-11-06 DIAGNOSIS — Z8742 Personal history of other diseases of the female genital tract: Secondary | ICD-10-CM | POA: Insufficient documentation

## 2013-11-06 DIAGNOSIS — F17200 Nicotine dependence, unspecified, uncomplicated: Secondary | ICD-10-CM | POA: Insufficient documentation

## 2013-11-06 DIAGNOSIS — E119 Type 2 diabetes mellitus without complications: Secondary | ICD-10-CM | POA: Insufficient documentation

## 2013-11-06 DIAGNOSIS — R059 Cough, unspecified: Secondary | ICD-10-CM | POA: Insufficient documentation

## 2013-11-06 DIAGNOSIS — I10 Essential (primary) hypertension: Secondary | ICD-10-CM | POA: Insufficient documentation

## 2013-11-06 DIAGNOSIS — Z8701 Personal history of pneumonia (recurrent): Secondary | ICD-10-CM | POA: Insufficient documentation

## 2013-11-06 DIAGNOSIS — Z79899 Other long term (current) drug therapy: Secondary | ICD-10-CM | POA: Insufficient documentation

## 2013-11-06 DIAGNOSIS — R42 Dizziness and giddiness: Secondary | ICD-10-CM | POA: Insufficient documentation

## 2013-11-06 DIAGNOSIS — Z8709 Personal history of other diseases of the respiratory system: Secondary | ICD-10-CM | POA: Insufficient documentation

## 2013-11-06 DIAGNOSIS — R05 Cough: Secondary | ICD-10-CM | POA: Insufficient documentation

## 2013-11-06 DIAGNOSIS — Z8619 Personal history of other infectious and parasitic diseases: Secondary | ICD-10-CM | POA: Insufficient documentation

## 2013-11-06 DIAGNOSIS — R6889 Other general symptoms and signs: Secondary | ICD-10-CM | POA: Insufficient documentation

## 2013-11-06 DIAGNOSIS — J3489 Other specified disorders of nose and nasal sinuses: Secondary | ICD-10-CM | POA: Insufficient documentation

## 2013-11-06 DIAGNOSIS — H81399 Other peripheral vertigo, unspecified ear: Secondary | ICD-10-CM

## 2013-11-06 DIAGNOSIS — Z880 Allergy status to penicillin: Secondary | ICD-10-CM | POA: Insufficient documentation

## 2013-11-06 LAB — BASIC METABOLIC PANEL
BUN: 11 mg/dL (ref 6–23)
CALCIUM: 8.9 mg/dL (ref 8.4–10.5)
CO2: 27 mEq/L (ref 19–32)
Chloride: 103 mEq/L (ref 96–112)
Creatinine, Ser: 0.94 mg/dL (ref 0.50–1.10)
GFR calc Af Amer: >90 mL/min (ref >90)
GFR calc non Af Amer: 78 mL/min — ABNORMAL LOW (ref >90)
GLUCOSE: 258 mg/dL — AB (ref 70–99)
Potassium: 4.3 mEq/L (ref 3.7–5.3)
Sodium: 139 mEq/L (ref 137–147)

## 2013-11-06 LAB — URINALYSIS, ROUTINE W REFLEX MICROSCOPIC
BILIRUBIN URINE: NEGATIVE
Glucose, UA: >1000 mg/dL — AB
Ketones, ur: NEGATIVE mg/dL
Leukocytes, UA: NEGATIVE
NITRITE: NEGATIVE
PH: 6 (ref 5.0–8.0)
Protein, ur: NEGATIVE mg/dL
Specific Gravity, Urine: 1.015 (ref 1.005–1.030)
Urobilinogen, UA: 0.2 mg/dL (ref 0.0–1.0)

## 2013-11-06 LAB — CBC WITH DIFFERENTIAL/PLATELET
Basophils Absolute: 0.1 10*3/uL (ref 0.0–0.1)
Basophils Relative: 1 % (ref 0–1)
EOS ABS: 0.2 10*3/uL (ref 0.0–0.7)
EOS PCT: 3 % (ref 0–5)
HCT: 40.3 % (ref 36.0–46.0)
HEMOGLOBIN: 13.3 g/dL (ref 12.0–15.0)
LYMPHS ABS: 2.1 10*3/uL (ref 0.7–4.0)
Lymphocytes Relative: 32 % (ref 12–46)
MCH: 28.6 pg (ref 26.0–34.0)
MCHC: 33 g/dL (ref 30.0–36.0)
MCV: 86.7 fL (ref 78.0–100.0)
MONO ABS: 0.4 10*3/uL (ref 0.1–1.0)
MONOS PCT: 5 % (ref 3–12)
Neutro Abs: 4 10*3/uL (ref 1.7–7.7)
Neutrophils Relative %: 59 % (ref 43–77)
PLATELETS: 233 10*3/uL (ref 150–400)
RBC: 4.65 MIL/uL (ref 3.87–5.11)
RDW: 13.3 % (ref 11.5–15.5)
WBC: 6.7 10*3/uL (ref 4.0–10.5)

## 2013-11-06 LAB — CBG MONITORING, ED: Glucose-Capillary: 346 mg/dL — ABNORMAL HIGH (ref 70–99)

## 2013-11-06 LAB — URINE MICROSCOPIC-ADD ON

## 2013-11-06 LAB — PREGNANCY, URINE: Preg Test, Ur: NEGATIVE

## 2013-11-06 MED ORDER — MECLIZINE HCL 25 MG PO TABS: 25.0000 mg | ORAL_TABLET | Freq: Three times a day (TID) | ORAL | Status: DC | PRN

## 2013-11-06 MED ORDER — MECLIZINE HCL 12.5 MG PO TABS
25.0000 mg | ORAL_TABLET | Freq: Once | ORAL | Status: AC
  Administered 2013-11-06: 25 mg via ORAL
  Filled 2013-11-06: qty 2

## 2013-11-06 NOTE — ED Notes (Signed)
Pt noticed, "light headedness, dizziness and numbness around mouth" for 3 days.  Headache intermittently, No n/v

## 2013-11-06 NOTE — Discharge Instructions (Signed)
°Emergency Department Resource Guide °1) Find a Doctor and Pay Out of Pocket °Although you won't have to find out who is covered by your insurance plan, it is a good idea to ask around and get recommendations. You will then need to call the office and see if the doctor you have chosen will accept you as a new patient and what types of options they offer for patients who are self-pay. Some doctors offer discounts or will set up payment plans for their patients who do not have insurance, but you will need to ask so you aren't surprised when you get to your appointment. ° °2) Contact Your Local Health Department °Not all health departments have doctors that can see patients for sick visits, but many do, so it is worth a call to see if yours does. If you don't know where your local health department is, you can check in your phone book. The CDC also has a tool to help you locate your state's health department, and many state websites also have listings of all of their local health departments. ° °3) Find a Walk-in Clinic °If your illness is not likely to be very severe or complicated, you may want to try a walk in clinic. These are popping up all over the country in pharmacies, drugstores, and shopping centers. They're usually staffed by nurse practitioners or physician assistants that have been trained to treat common illnesses and complaints. They're usually fairly quick and inexpensive. However, if you have serious medical issues or chronic medical problems, these are probably not your best option. ° °No Primary Care Doctor: °- Call [facility] at  832-8000 - they can help you locate a primary care doctor that  accepts your insurance, provides certain services, etc. °- Physician Referral Service- 1-800-533-3463 ° °Chronic Pain Problems: °Organization         Address  Phone   Notes  °[facility]  (336) 297-2271 Patients need to be referred by their primary care doctor.  ° °Medication  Assistance: °Organization         Address  Phone   Notes  °[place] [facility] [address] °[place], [place] [address] (336) 641-8030 --Must be a resident of [place] °-- Must have NO insurance coverage whatsoever (no Medicaid/ Medicare, etc.) °-- The pt. MUST have a primary care doctor that directs their care regularly and follows them in the community °  °[facility]  (866) 331-1348   °[employer]  (888) 892-1162   ° °Agencies that provide inexpensive medical care: °Organization         Address  Phone   Notes  °[facility]  (336) 832-8035   °[facility]    (336) 832-7272   °[facility] [address] °[place], [place] [address] (336) 832-4777   °[facility] [address], °[place] (336) 271-4999   °[facility]    (336) 373-0678   °[facility]    (336) 272-1050   °[facility] ° [address], [place] Phone:  (336) 832-4444, Fax:  (336) 832-4440 Hours of Operation:  9 am - 6 pm, M-F.  Also accepts Medicaid/Medicare and self-pay.  °[facility] ° [address], [place] Phone: (336) 832-3150, Fax: (336) [phone]. Hours of Operation:  8:30 am - 5:30 pm, M-F.  Also accepts Medicaid and self-pay.  °[facility] [place] [address], [place] Phone: (336) 878-6027   °[facility] [address], [place], [place] (336)723-1848, Ext. 123 Mondays & Thursdays: 7-9 AM.  First 15 patients are seen on a first come, first serve basis. °  ° °Medicaid-accepting [place] Providers: ° °Organization         Address  Phone   Notes  °[facility] [address], [place] (336) 641-2100 Also accepts self-pay patients.  °[facility] [address], [place] ° (336) 856-9996   °[facility] [address], [place]  (336) 288-8857   °[facility] [address] [place] [address], [place] (336) 299-7000   °Veita Bland [address], [place]  ° (336) 373-1557 Only accepts [place] Access Medicaid patients after they have their name applied to their card.  ° °Self-Pay (no insurance) in [place]: ° °Organization         Address  Phone   Notes  °Sickle Cell Patients, [facility] [address], [place] (336) 832-1970   °[facility] [address], [place] (336) 832-4400   °[facility] ° [address], [place] (336) 992-4800   °[facility]/Dr. Osei-Bonsu ° [address] [place] [address], [place] or [address], [place] (336) 841-8500 Phone number for both [place] and [place] locations is the same.  °[facility] [address], [place] (336) 299-0000   °[facility] [address] [place] [address], [place] or [address] (336) 852-7530 °(336) 878-2260   °[facility] [address], [place] (336) 350-1642, phone; (336) 294-5005, fax Sees patients 1st and 3rd Saturday of every month.  Must not qualify for public or private insurance (i.e. Medicaid, Medicare, [place] Health Choice, Veterans' Benefits) • Household income should be no more than 200% of the poverty level •The clinic cannot treat you if you are pregnant or think you are pregnant • Sexually transmitted diseases are not treated at the clinic.  ° ° °Dental Care: °Organization         Address  Phone  Notes  °[place] [facility] [address], [place] (336) 641-6152 Accepts children up to age 21 who are enrolled in Medicaid or [place] Health Choice; pregnant women with a Medicaid card; and children who have applied for Medicaid or [place] Health Choice, but were declined, whose parents can pay a reduced fee at time of service.  °[place]  [facility] [place]  [address], [place] (336) 641-7733 Accepts children up to age 21 who are enrolled in Medicaid or [place] Health Choice; pregnant women with a Medicaid card; and children who have applied for Medicaid or [place] Health Choice, but were declined, whose parents can pay a reduced fee at time of service.  °[facility] ° [address], [place] (336) 641-4533 Patients are seen by appointment only. Walk-ins are not accepted. [facility] will see patients 18 years of age and older. °Monday - Tuesday (8am-5pm) °Most Wednesdays (8:30-5pm) °$30 per visit, cash only  °[facility] ° [address], [place] (336) 641-4533 Patients are seen by appointment only. Walk-ins are not accepted. [facility] will see patients 18 years of age and older. °One   Wednesday Evening (Monthly: Volunteer Based).  $30 per visit, cash only  °[facility]  (919) 537-3737 for adults; Children under age 4, call Graduate Pediatric Dentistry at ([phone]) [phone]. Children aged 4-14, please call ([phone]) [phone] to request a pediatric application. ° Dental services are provided in all areas of dental care including fillings, crowns and bridges, complete and partial dentures, implants, gum treatment, root canals, and extractions. Preventive care is also provided. Treatment is provided to both adults and children. °Patients are selected via a lottery and there is often a waiting list. °  °[facility] [address], °[place] ° (336) 763-8833 [email or web address] °  °[facility] [address], [place], [place] (336)723-1848, Ext. 123 Second and Fourth Thursday of each month, opens at 6:30 AM; Clinic ends at 9 AM.  Patients are seen on a first-come first-served basis, and a limited number are seen during each clinic.  ° °[facility] ° [address], [place], [place] (336) 723-7904    Eligibility Requirements °You must have lived in [place], [place], or [place] counties for at least the last three months. °  You cannot be eligible for state or federal sponsored healthcare insurance, including Veterans Administration, Medicaid, or Medicare. °  You generally cannot be eligible for healthcare insurance through your employer.  °  How to apply: °Eligibility screenings are held every Tuesday and Wednesday afternoon from 1:00 pm until 4:00 pm. You do not need an appointment for the interview!  °[facility] [address], [place], [place] 336-631-2330   °[facility]  336-342-8273   °[facility]  336-703-3100   °[facility]  336-570-6415   ° °Behavioral Health Resources in the Community: °Intensive Outpatient Programs °Organization         Address  Phone  Notes  °[facility] [address], [place], [place] 336-878-6098   °[facility] [address], [place], [place] 336-832-9800   °[facility] [address], [place], [place] ° 336-882-2125   °[facility] [address],  °[place], [place] 1-800-853-5163 or 336-641-4981   °Substance Abuse Resources °Organization         Address  Phone  Notes  °[facility]  336-882-2125   °[facility]  336-784-9470   °[facility]  336-285-9073   °[facility]  336-845-3988   °Residential & Outpatient Substance Abuse Program  1-800-659-3381   °Psychological Services °Organization         Address  Phone  Notes  °[facility]  336- 832-9600   °[facility]  336- 378-7881   °[facility] [address], [place] 1-800-853-5163 or 336-641-4981   ° °Mobile Crisis Teams °Organization         Address  Phone  Notes  °[facility], Mobile Crisis Care Unit  1-877-626-1772   °Assertive °[facility] ° [address]  [place], [place] 336-834-9664   °Sharon DeEsch [address] °[place] 336-554-5454   ° °Self-Help/Support Groups °Organization         Address  Phone             Notes  °[facility]  - variety of support groups  336- 373-1402 Call for more information  °Narcotics Anonymous (NA), [facility] [address], °[place]  2 meetings at this location  ° °  Residential Treatment Programs Organization         Address  Phone  Notes  [facility] [address],    [place]  [phone]   [facility]  [address], [place] [phone], [place], [place] [phone]   [facility] [address], [place] [phone] Admissions: 8am-3pm M-F  [facility] [address]    [place], [place] [phone]   [facility] [address], [place], [place] [phone]   [facility] [address]  [place], [place] [phone]   [facility] Intensive Outpatient [address], [place], [place] [phone]   [facility] [address]  [place], [place] [phone] or [phone]   [facility] [address] [place], [place] [phone] Accepts Medicaid  [facility] [address]  [place] [phone] Substance Abuse/Addiction Treatment   [facility]         Address  Phone  Notes  [facility]  657-012-1216(888) (610)074-5429   Angie FavaJulie Brannon, PhD [address], [address] [place], [place]   [phone](336) ([phone] or [phone](336) ([phone]   [facility]   [address] [place], [place] [phone](336) [phone]   [facility] [address], [place], [place] [phone](336) ([phone] Insurance/Medicaid/sponsorship through [facility] [address]                                    [place], [place] [phone](336) [phone] Therapy/tele-psych/case    [facility] [address]   [place], [place] [phone](336) [phone]    Dr. Lolly MustacheArfeen  [phone](336) [phone]   [facility]  [employer] [facility] 1) [address], [place] 2) [address], [place] 3)  [address], [place] [phone](336) [phone](336) [phone]  [phone](336) [phone]   [facility] Child Abuse Hotline 6781764899(336) (684)789-0826 or (419)[phone](336) (405)[phone] (After Hours)       Take over the counter decongestant (such as sudafed), as well as an antihistamine (such as claritin or zyrtec), as directed on packaging, for the next week.  Use over the counter normal saline nasal spray, as instructed in the Emergency Department, several times per [place] for the next 2 weeks.  Call your regular medical doctor and ENT doctor tomorrow to schedule a follow up appointment within the next week.  Return to the Emergency Department immediately if worsening.

## 2013-11-06 NOTE — ED Notes (Signed)
Pt back from CT

## 2013-11-06 NOTE — ED Notes (Signed)
Patient given discharge instruction, verbalized understand. Patient ambulatory out of the department.  

## 2013-11-06 NOTE — ED Provider Notes (Signed)
CSN: [id number]     Arrival date & time 11/06/13  1201 History   First MD Initiated Contact with Patient 11/06/13 1310     Chief Complaint  Patient presents with  . Dizziness      HPI Pt was seen at 1310. Per pt, c/o gradual onset and persistence of multiple intermittent episodes of "dizziness" for the past 3 to 4 days. Describes the dizziness as "everything is spinning around." Has been associated with numbness and tingling around her mouth. Pt also c/o gradual onset and persistence of constant runny/stuffy nose, sinus congestion, and cough for the past 2 weeks.  Denies fevers, no rash, no CP/SOB, no N/V/D, no abd pain.     Past Medical History  Diagnosis Date  . Diabetes mellitus   . GERD (gastroesophageal reflux disease)   . Hypertension   . Bronchitis     hx of as child  . PONV (postoperative nausea and vomiting)   . Pneumonia     hx of as child  . BV (bacterial vaginosis)   . Diabetes 09/19/2012  . Contraception management 09/19/2012  . Vaginal discharge 05/20/2013  . Vaginal itching 05/20/2013   Past Surgical History  Procedure Laterality Date  . Wisdom tooth extraction    . Pars plana vitrectomy  09/13/2011    Procedure: PARS PLANA VITRECTOMY WITH 25 GAUGE;  Surgeon: Edmon CrapeGary A Rankin, MD;  [facility];  Service: Ophthalmology;  Laterality: Right;  . Pars plana vitrectomy  10/27/2011    Procedure: PARS PLANA VITRECTOMY WITH 25 GAUGE;  Surgeon: Edmon CrapeGary A Rankin, MD;  [facility];  Service: Ophthalmology;  Laterality: Right;  . Eye surgery  march 2013, laser prp  [facility]    vitrectomy  . Cataract extraction w/phaco  11/27/2011    Procedure: CATARACT EXTRACTION PHACO AND INTRAOCULAR LENS PLACEMENT (IOC);  Surgeon: Gemma PayorKerry Hunt, MD;  Location: [facility];  Service: Ophthalmology;  Laterality: Left;  CDE:3.02  . Pars plana vitrectomy  02/16/2012    Procedure: PARS PLANA VITRECTOMY WITH 25 GAUGE;  Surgeon: Edmon CrapeGary A Rankin, MD;  [facility];  Service: Ophthalmology;  Laterality: Left;  Repair  Retinal Detachment  . Gas/fluid exchange  02/16/2012    Procedure: GAS/FLUID EXCHANGE;  Surgeon: Edmon CrapeGary A Rankin, MD;  [facility];  Service: Ophthalmology;  Laterality: Left;  SF6  . Cesarean section     Family History  Problem Relation Age of Onset  . Cancer Mother 4150    breast  . Hypertension Mother   . Diabetes Father   . Diabetes Sister   . Anesthesia problems Neg Hx   . Hypotension Neg Hx   . Malignant hyperthermia Neg Hx   . Pseudochol deficiency Neg Hx   . Diabetes Maternal Grandmother   . Diabetes Maternal Grandfather   . Diabetes Paternal Grandmother   . Diabetes Paternal Grandfather   . Diabetes Sister   . Seizures Daughter    History  Substance Use Topics  . Smoking status: Current Some Sinning Smoker -- 0.50 packs/Goers for 15 years    Types: Cigarettes  . Smokeless tobacco: Never Used  . Alcohol Use: Yes     Comment: occ- <1 a week, wine, beer or mixed drink   OB History   Grav Para Term Preterm Abortions TAB SAB Ect Mult Living   1 1  1      1      Review of Systems ROS: Statement: All systems negative except as marked or noted in the HPI;  Constitutional: Negative for fever and chills. ; ; Eyes: Negative for eye pain, redness and discharge. ; ; ENMT: Negative for ear pain, hoarseness, sore throat. +nasal congestion, sinus pressure and rhinorrhea. ; ; Cardiovascular: Negative for chest pain, palpitations, diaphoresis, dyspnea and peripheral edema. ; ; Respiratory: +cough. Negative for wheezing and stridor. ; ; Gastrointestinal: Negative for nausea, vomiting, diarrhea, abdominal pain, blood in stool, hematemesis, jaundice and rectal bleeding.; ; Genitourinary: Negative for dysuria, flank pain and hematuria. ; ; Musculoskeletal: Negative for back pain and neck pain. Negative for swelling and trauma.; ; Skin: Negative for pruritus, rash, abrasions, blisters, bruising and skin lesion.; ; Neuro: +"dizziness," paresthesias. Negative for headache, lightheadedness and neck  stiffness. Negative for weakness, altered level of consciousness , altered mental status, extremity weakness, involuntary movement, seizure and syncope.      Allergies  Food and Penicillins  Home Medications   Prior to Admission medications   Medication Sig Start Date End Date Taking? Authorizing Provider  insulin aspart (NOVOLOG) 100 UNIT/ML injection Inject 15-20 Units into the skin 3 (three) times daily before meals. 20 units are taken in the mornings, 15 units with lunch and dinner   Yes Historical Provider, MD  insulin glargine (LANTUS) 100 UNIT/ML injection Inject 30 Units into the skin daily.   Yes Historical Provider, MD  loratadine (CLARITIN) 10 MG tablet Take 10 mg by mouth daily as needed for allergies.   Yes Historical Provider, MD  losartan (COZAAR) 50 MG tablet Take 50 mg by mouth daily.   Yes Historical Provider, MD  metoprolol succinate (TOPROL-XL) 100 MG 24 hr tablet Take 100 mg by mouth daily. Take with or immediately following a meal.   Yes Historical Provider, MD  norethindrone (MICRONOR,CAMILA,ERRIN) 0.35 MG tablet TAKE 1 TABLET BY MOUTH DAILY   Yes Adline Potter, NP  PARoxetine (PAXIL) 20 MG tablet Take 20 mg by mouth at bedtime.    Yes Historical Provider, MD  acetaminophen (TYLENOL) 500 MG tablet Take 1,000 mg by mouth every 6 (six) hours as needed. Pain    Historical Provider, MD   BP 153/82  Pulse 84  Temp(Src) 98.2 F (36.8 C) (Oral)  Resp 20  Ht 5\' 1"  (1.549 m)  Wt 215 lb (97.523 kg)  BMI 40.64 kg/m2  SpO2 99%  LMP 10/13/2013 Physical Exam 1315: Physical examination:  Nursing notes reviewed; Vital signs and O2 SAT reviewed;  Constitutional: Well developed, Well nourished, Well hydrated, In no acute distress; Head:  Normocephalic, atraumatic; Eyes: EOMI, PERRL, No scleral icterus; ENMT: TM's clear bilat. +edemetous nasal turbinates bilat with clear rhinorrhea. Mouth and pharynx normal, Mucous membranes moist; Neck: Supple, Full range of motion, No  lymphadenopathy; Cardiovascular: Regular rate and rhythm, No murmur, rub, or gallop; Respiratory: Breath sounds clear & equal bilaterally, No rales, rhonchi, wheezes.  Speaking full sentences with ease, Normal respiratory effort/excursion; Chest: Nontender, Movement normal; Abdomen: Soft, Nontender, Nondistended, Normal bowel sounds; Genitourinary: No CVA tenderness; Extremities: Pulses normal, No tenderness, No edema, No calf edema or asymmetry.; Neuro: AA&Ox3, Major CN grossly intact.Speech clear.  No facial droop. +right horizontal end gaze fatigable nystagmus. Grips equal. Strength 5/5 equal bilat UE's and LE's.  DTR 2/4 equal bilat UE's and LE's.  No gross sensory deficits.  Normal cerebellar testing bilat UE's (finger-nose) and LE's (heel-shin)..; Skin: Color normal, Warm, Dry.    ED Course  Procedures     EKG Interpretation None      MDM  MDM Reviewed: previous chart, vitals and nursing note  Reviewed previous: labs Interpretation: labs, x-ray, CT scan and ECG     Results for orders placed during the hospital encounter of 11/06/13  URINALYSIS, ROUTINE W REFLEX MICROSCOPIC      Result Value Ref Range   Color, Urine YELLOW  YELLOW   APPearance CLEAR  CLEAR   Specific Gravity, Urine 1.015  1.005 - 1.030   pH 6.0  5.0 - 8.0   Glucose, UA >1000 (*) NEGATIVE mg/dL   Hgb urine dipstick SMALL (*) NEGATIVE   Bilirubin Urine NEGATIVE  NEGATIVE   Ketones, ur NEGATIVE  NEGATIVE mg/dL   Protein, ur NEGATIVE  NEGATIVE mg/dL   Urobilinogen, UA 0.2  0.0 - 1.0 mg/dL   Nitrite NEGATIVE  NEGATIVE   Leukocytes, UA NEGATIVE  NEGATIVE  PREGNANCY, URINE      Result Value Ref Range   Preg Test, Ur NEGATIVE  NEGATIVE  CBC WITH DIFFERENTIAL      Result Value Ref Range   WBC 6.7  4.0 - 10.5 K/uL   RBC 4.65  3.87 - 5.11 MIL/uL   Hemoglobin 13.3  12.0 - 15.0 g/dL   HCT 04.540.3  40.936.0 - 81.146.0 %   MCV 86.7  78.0 - 100.0 fL   MCH 28.6  26.0 - 34.0 pg   MCHC 33.0  30.0 - 36.0 g/dL   RDW 91.413.3  78.211.5 -  95.615.5 %   Platelets 233  150 - 400 K/uL   Neutrophils Relative % 59  43 - 77 %   Neutro Abs 4.0  1.7 - 7.7 K/uL   Lymphocytes Relative 32  12 - 46 %   Lymphs Abs 2.1  0.7 - 4.0 K/uL   Monocytes Relative 5  3 - 12 %   Monocytes Absolute 0.4  0.1 - 1.0 K/uL   Eosinophils Relative 3  0 - 5 %   Eosinophils Absolute 0.2  0.0 - 0.7 K/uL   Basophils Relative 1  0 - 1 %   Basophils Absolute 0.1  0.0 - 0.1 K/uL  BASIC METABOLIC PANEL      Result Value Ref Range   Sodium 139  137 - 147 mEq/L   Potassium 4.3  3.7 - 5.3 mEq/L   Chloride 103  96 - 112 mEq/L   CO2 27  19 - 32 mEq/L   Glucose, Bld 258 (*) 70 - 99 mg/dL   BUN 11  6 - 23 mg/dL   Creatinine, Ser 2.130.94  0.50 - 1.10 mg/dL   Calcium 8.9  8.4 - 08.610.5 mg/dL   GFR calc non Af Amer 78 (*) >90 mL/min   GFR calc Af Amer >90  >90 mL/min  URINE MICROSCOPIC-ADD ON      Result Value Ref Range   Squamous Epithelial / LPF FEW (*) RARE   RBC / HPF 3-6  <3 RBC/hpf   Bacteria, UA FEW (*) RARE  CBG MONITORING, ED      Result Value Ref Range   Glucose-Capillary 346 (*) 70 - 99 mg/dL   Comment 1 Documented in Chart     Comment 2 Notify RN     Dg Chest 2 View 11/06/2013   CLINICAL DATA:  Dizziness.  EXAM: CHEST  2 VIEW  COMPARISON:  August 02, 2013.  FINDINGS: The heart size and mediastinal contours are within normal limits. Both lungs are clear. No pneumothorax or pleural effusion is noted. The visualized skeletal structures are unremarkable.  IMPRESSION: No acute cardiopulmonary abnormality seen.   Electronically Signed  By: Roque Lias M.D.   On: 11/06/2013 14:29   Ct Head Wo Contrast 11/06/2013   CLINICAL DATA:  Dizziness for 4 days  EXAM: CT HEAD WITHOUT CONTRAST  TECHNIQUE: Contiguous axial images were obtained from the base of the skull through the vertex without intravenous contrast. Study was obtained within 24 hr of patient's arrival at the emergency department.  COMPARISON:  None.  FINDINGS: The ventricles are normal in size and  configuration. There is no mass, hemorrhage, extra-axial fluid collection, or midline shift gray-white compartments appear normal. No acute infarct apparent. Bony calvarium appears intact. The mastoid air cells are clear.  IMPRESSION: Study within normal limits.   Electronically Signed   By: Bretta Bang M.D.   On: 11/06/2013 14:49    1540:  Pt is not orthostatic. Neuro exam remains intact. Has tol PO well without N/C. CBG elevated per hx DM, not acidotic. Pt feels better after meds and wants to go home now. Dx and testing d/w pt.  Questions answered.  Verb understanding, agreeable to d/c home with outpt f/u.    Laray Anger, DO 11/09/13 1249

## 2013-12-31 ENCOUNTER — Ambulatory Visit (INDEPENDENT_AMBULATORY_CARE_PROVIDER_SITE_OTHER): Payer: Medicaid Other | Admitting: Adult Health

## 2013-12-31 ENCOUNTER — Encounter: Payer: Self-pay | Admitting: Adult Health

## 2013-12-31 VITALS — BP 140/90 | Ht 61.0 in | Wt 219.5 lb

## 2013-12-31 DIAGNOSIS — N939 Abnormal uterine and vaginal bleeding, unspecified: Secondary | ICD-10-CM | POA: Insufficient documentation

## 2013-12-31 DIAGNOSIS — B9689 Other specified bacterial agents as the cause of diseases classified elsewhere: Secondary | ICD-10-CM

## 2013-12-31 DIAGNOSIS — A499 Bacterial infection, unspecified: Secondary | ICD-10-CM

## 2013-12-31 DIAGNOSIS — N926 Irregular menstruation, unspecified: Secondary | ICD-10-CM

## 2013-12-31 DIAGNOSIS — Z3202 Encounter for pregnancy test, result negative: Secondary | ICD-10-CM

## 2013-12-31 DIAGNOSIS — L293 Anogenital pruritus, unspecified: Secondary | ICD-10-CM

## 2013-12-31 DIAGNOSIS — N760 Acute vaginitis: Secondary | ICD-10-CM

## 2013-12-31 DIAGNOSIS — N898 Other specified noninflammatory disorders of vagina: Secondary | ICD-10-CM

## 2013-12-31 HISTORY — DX: Abnormal uterine and vaginal bleeding, unspecified: N93.9

## 2013-12-31 LAB — COMPREHENSIVE METABOLIC PANEL
ALK PHOS: 71 U/L (ref 39–117)
ALT: 12 U/L (ref 0–35)
AST: 20 U/L (ref 0–37)
Albumin: 3.8 g/dL (ref 3.5–5.2)
BUN: 10 mg/dL (ref 6–23)
CO2: 24 meq/L (ref 19–32)
Calcium: 9.1 mg/dL (ref 8.4–10.5)
Chloride: 105 mEq/L (ref 96–112)
Creat: 1.01 mg/dL (ref 0.50–1.10)
GLUCOSE: 59 mg/dL — AB (ref 70–99)
POTASSIUM: 4.2 meq/L (ref 3.5–5.3)
Sodium: 140 mEq/L (ref 135–145)
TOTAL PROTEIN: 6.5 g/dL (ref 6.0–8.3)
Total Bilirubin: 0.5 mg/dL (ref 0.2–1.2)

## 2013-12-31 LAB — CBC
HEMATOCRIT: 44 % (ref 36.0–46.0)
HEMOGLOBIN: 14.8 g/dL (ref 12.0–15.0)
MCH: 29.4 pg (ref 26.0–34.0)
MCHC: 33.6 g/dL (ref 30.0–36.0)
MCV: 87.5 fL (ref 78.0–100.0)
Platelets: 280 10*3/uL (ref 150–400)
RBC: 5.03 MIL/uL (ref 3.87–5.11)
RDW: 14.4 % (ref 11.5–15.5)
WBC: 6 10*3/uL (ref 4.0–10.5)

## 2013-12-31 LAB — POCT URINE PREGNANCY: PREG TEST UR: NEGATIVE

## 2013-12-31 LAB — POCT WET PREP (WET MOUNT)

## 2013-12-31 LAB — TSH: TSH: 1.983 u[IU]/mL (ref 0.350–4.500)

## 2013-12-31 MED ORDER — METRONIDAZOLE 500 MG PO TABS: 500.0000 mg | ORAL_TABLET | Freq: Two times a day (BID) | ORAL | Status: DC

## 2013-12-31 NOTE — Progress Notes (Signed)
Subjective:     Patient ID: Marcheta GrammesKrika L Ingraham, female   DOB: 01-Jun-1976, 38 y.o.   MRN: [id number]  HPI [name] is a 38 year old black female in complaining of bleeding,she had a 2 Mcclafferty period 7/2 and started back 7/7 and is still spotting, some clots and cramps but not bad.She does have a new sex partner.and she is taking Micronor.Also has had an itch.  Review of Systems See HPI Reviewed past medical,surgical, social and family history. Reviewed medications and allergies.     Objective:   Physical Exam BP 140/90  Ht 5\' 1"  (1.549 m)  Wt 219 lb 8 oz (99.565 kg)  BMI 41.50 kg/m2  LMP 07/03/2015UPT negative, Skin warm and dry.Pelvic: external genitalia is normal in appearance, vagina: bloody discharge with odor, cervix:smooth, negative CMT,uterus: normal size, shape and contour, non tender, no masses felt, adnexa: no masses or tenderness noted. Wet prep: + for clue cells. GC/CHL obtained.     Assessment:     AUB Vaginal itch BV    Plan:     GC/CHL sent Check CBC,CMP and TSH Return in 1 month for pap and physical Rx flagyl 500 mg 1 bid x 7 days, no alcohol, review handout on BV

## 2013-12-31 NOTE — Patient Instructions (Signed)
Bacterial Vaginosis Bacterial vaginosis is a vaginal infection that occurs when the normal balance of bacteria in the vagina is disrupted. It results from an overgrowth of certain bacteria. This is the most common vaginal infection in women of childbearing age. Treatment is important to prevent complications, especially in pregnant women, as it can cause a premature delivery. CAUSES  Bacterial vaginosis is caused by an increase in harmful bacteria that are normally present in smaller amounts in the vagina. Several different kinds of bacteria can cause bacterial vaginosis. However, the reason that the condition develops is not fully understood. RISK FACTORS Certain activities or behaviors can put you at an increased risk of developing bacterial vaginosis, including:  Having a new sex partner or multiple sex partners.  Douching.  Using an intrauterine device (IUD) for contraception. Women do not get bacterial vaginosis from toilet seats, bedding, swimming pools, or contact with objects around them. SIGNS AND SYMPTOMS  Some women with bacterial vaginosis have no signs or symptoms. Common symptoms include:  Grey vaginal discharge.  A fishlike odor with discharge, especially after sexual intercourse.  Itching or burning of the vagina and vulva.  Burning or pain with urination. DIAGNOSIS  Your health care provider will take a medical history and examine the vagina for signs of bacterial vaginosis. A sample of vaginal fluid may be taken. Your health care provider will look at this sample under a microscope to check for bacteria and abnormal cells. A vaginal pH test may also be done.  TREATMENT  Bacterial vaginosis may be treated with antibiotic medicines. These may be given in the form of a pill or a vaginal cream. A second round of antibiotics may be prescribed if the condition comes back after treatment.  HOME CARE INSTRUCTIONS   Only take over-the-counter or prescription medicines as  directed by your health care provider.  If antibiotic medicine was prescribed, take it as directed. Make sure you finish it even if you start to feel better.  Do not have sex until treatment is completed.  Tell all sexual partners that you have a vaginal infection. They should see their health care provider and be treated if they have problems, such as a mild rash or itching.  Practice safe sex by using condoms and only having one sex partner. SEEK MEDICAL CARE IF:   Your symptoms are not improving after 3 days of treatment.  You have increased discharge or pain.  You have a fever. MAKE SURE YOU:   Understand these instructions.  Will watch your condition.  Will get help right away if you are not doing well or get worse. FOR MORE INFORMATION  Centers for Disease Control and Prevention, Division of STD Prevention: SolutionApps.co.zawww.cdc.gov/std American Sexual Health Association (ASHA): www.ashastd.org  Document Released: 06/05/2005 Document Revised: 03/26/2013 Document Reviewed: 01/15/2013 [facility] Patient Information 2015 [employer], [place]. This information is not intended to replace advice given to you by your health care provider. Make sure you discuss any questions you have with your health care provider. Take flagyl no sex no alcohol return in `1 month for pap

## 2014-01-01 ENCOUNTER — Telehealth: Payer: Self-pay | Admitting: Adult Health

## 2014-01-01 LAB — GC/CHLAMYDIA PROBE AMP
CT Probe RNA: NEGATIVE
GC PROBE AMP APTIMA: NEGATIVE

## 2014-01-01 MED ORDER — FLUCONAZOLE 150 MG PO TABS: 150.0000 mg | ORAL_TABLET | Freq: Once | ORAL | Status: DC

## 2014-01-01 NOTE — Telephone Encounter (Signed)
Left message x 1. JSY 

## 2014-01-01 NOTE — Telephone Encounter (Signed)
Spoke with pt. Pt states she has had  yeast infection symptoms since Monday. She is requesting Diflucan. Thanks!!! [employer]SY

## 2014-01-01 NOTE — Telephone Encounter (Signed)
Left message labs normal, take meds

## 2014-02-03 ENCOUNTER — Ambulatory Visit (INDEPENDENT_AMBULATORY_CARE_PROVIDER_SITE_OTHER): Payer: Medicaid Other | Admitting: Adult Health

## 2014-02-03 ENCOUNTER — Encounter: Payer: Self-pay | Admitting: Adult Health

## 2014-02-03 ENCOUNTER — Other Ambulatory Visit (HOSPITAL_COMMUNITY)
Admission: RE | Admit: 2014-02-03 | Discharge: 2014-02-03 | Disposition: A | Discharge status: Home / Self Care | Payer: Medicaid Other | Source: Ambulatory Visit | Attending: Adult Health | Admitting: Adult Health

## 2014-02-03 VITALS — BP 130/82 | HR 76 | Ht 61.0 in | Wt 219.0 lb

## 2014-02-03 DIAGNOSIS — Z01419 Encounter for gynecological examination (general) (routine) without abnormal findings: Secondary | ICD-10-CM | POA: Diagnosis present

## 2014-02-03 DIAGNOSIS — Z3041 Encounter for surveillance of contraceptive pills: Secondary | ICD-10-CM

## 2014-02-03 DIAGNOSIS — Z1151 Encounter for screening for human papillomavirus (HPV): Secondary | ICD-10-CM | POA: Diagnosis present

## 2014-02-03 DIAGNOSIS — Z0000 Encounter for general adult medical examination without abnormal findings: Secondary | ICD-10-CM

## 2014-02-03 MED ORDER — METRONIDAZOLE 500 MG PO TABS: 500.0000 mg | ORAL_TABLET | Freq: Two times a day (BID) | ORAL | Status: DC

## 2014-02-03 MED ORDER — NORETHINDRONE 0.35 MG PO TABS: ORAL_TABLET | ORAL | Status: DC

## 2014-02-03 NOTE — Patient Instructions (Signed)
Physical in  1 year Mammogram at 42 

## 2014-02-03 NOTE — Progress Notes (Signed)
Patient ID: Connie Aguilar, female   DOB: 12-Feb-1976, 38 y.o.   MRN: [id number] History of Present Illness: Connie Aguilar is a 38 year old black female, in for a pap and physical.No complaints, needs refills on micronor and request refill on flagyl.   Current Medications, Allergies, Past Medical History, Past Surgical History, Family History and Social History were [facility].     Review of Systems: Patient denies any headaches, blurred vision, shortness of breath, chest pain, abdominal pain, problems with bowel movements, urination, or intercourse. No joint pain or mood changes, on paxil.    Physical Exam:BP 130/82  Pulse 76  Ht 5\' 1"  (1.549 m)  Wt 219 lb (99.338 kg)  BMI 41.40 kg/m2  LMP 01/29/2014 General:  Well developed, well nourished, no acute distress Skin:  Warm and dry Neck:  Midline trachea, normal thyroid Lungs; Clear to auscultation bilaterally Breast:  No dominant palpable mass, retraction, or nipple discharge Cardiovascular: Regular rate and rhythm Abdomen:  Soft, non tender, no hepatosplenomegaly Pelvic:  External genitalia is normal in appearance.  The vagina is normal in appearance, has period like blood in vault.The cervix is smooth, pap with HPV perofrmed.  Uterus is felt to be normal size, shape, and contour.  No adnexal masses or tenderness noted Extremities:  No swelling or varicosities noted Psych:  No mood changes,alert and cooperative,seems happy,going to take surgical tech.,form filled out for her.   Impression: Yearly gyn exam Contraceptive mangement    Plan: Refilled micronor x 1 year Rx flagyl 5000 mg 1 bid x 7 days with 2 refills Physical in 1 year Mammogram at 42 Labs with PCP

## 2014-02-06 LAB — CYTOLOGY - PAP

## 2014-03-02 ENCOUNTER — Telehealth: Payer: Self-pay | Admitting: *Deleted

## 2014-03-02 NOTE — Telephone Encounter (Signed)
Will bring form by  With immunization list

## 2014-04-20 ENCOUNTER — Encounter: Payer: Self-pay | Admitting: Adult Health

## 2014-08-13 IMAGING — CR DG KNEE COMPLETE 4+V*L*
4 series · 4 of 4 positions shown · non-contrast
Comparison: None.

CLINICAL DATA: Left knee injury/pain

LEFT KNEE - COMPLETE 4+ VIEW

[view not recorded (1 of 4)]
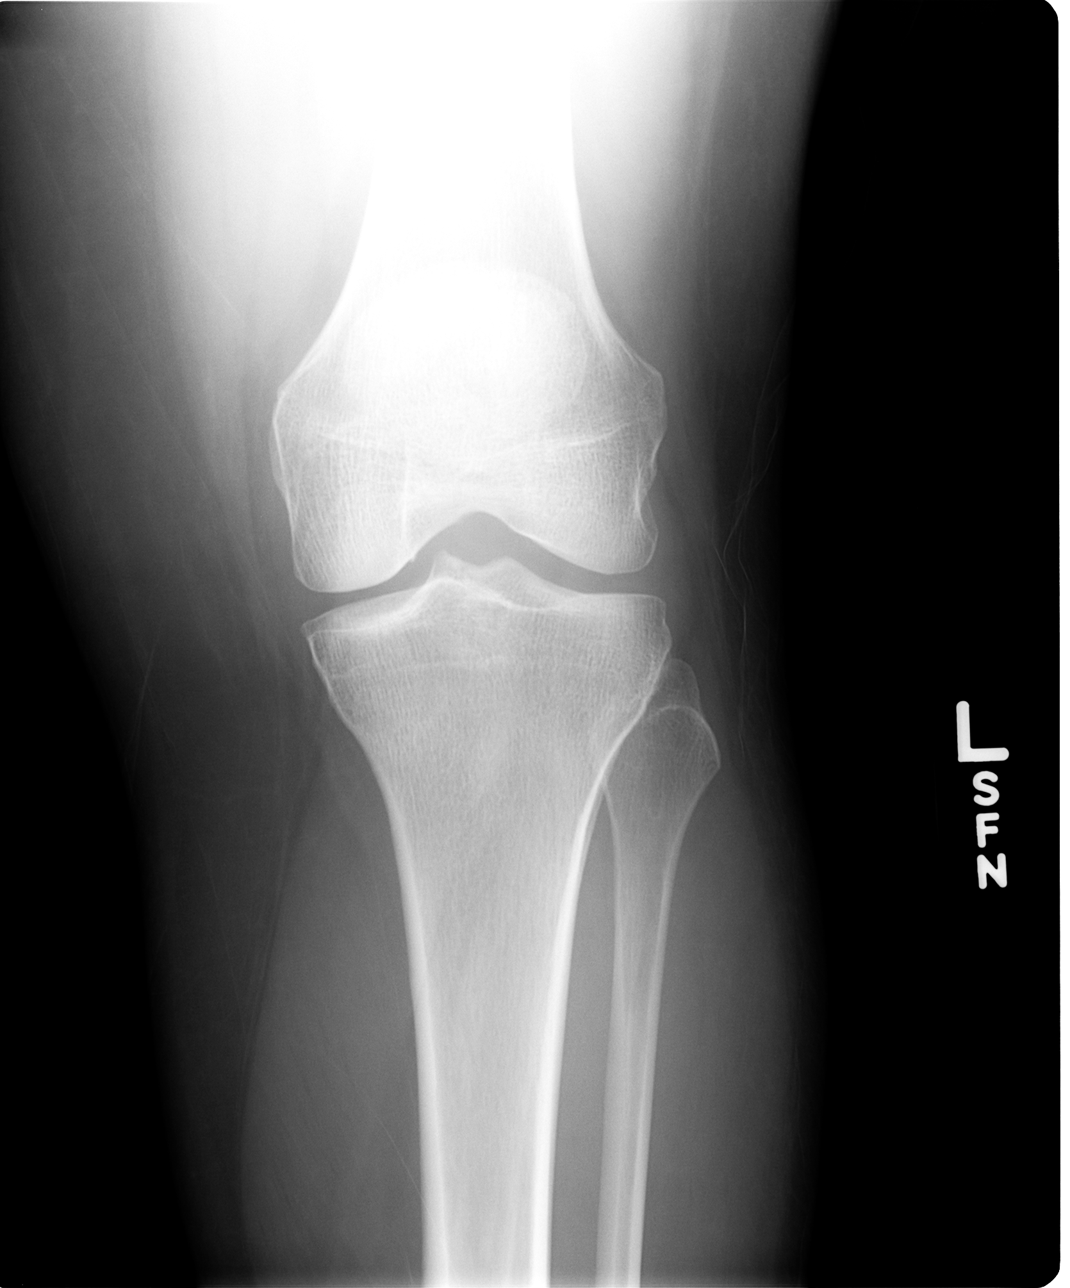

[view not recorded (2 of 4)]
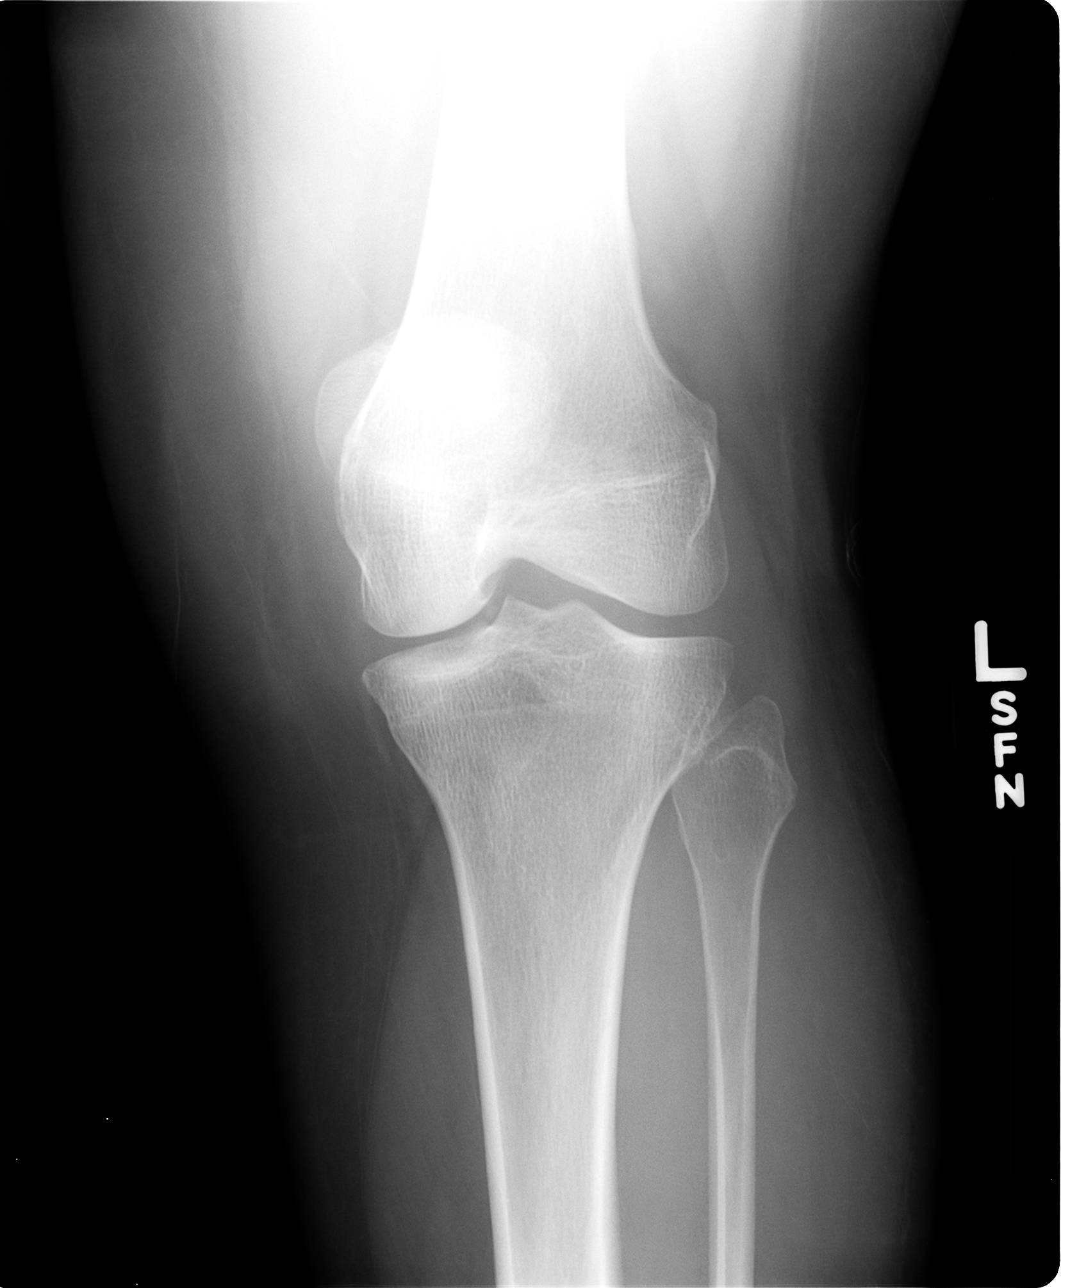

[view not recorded (3 of 4)]
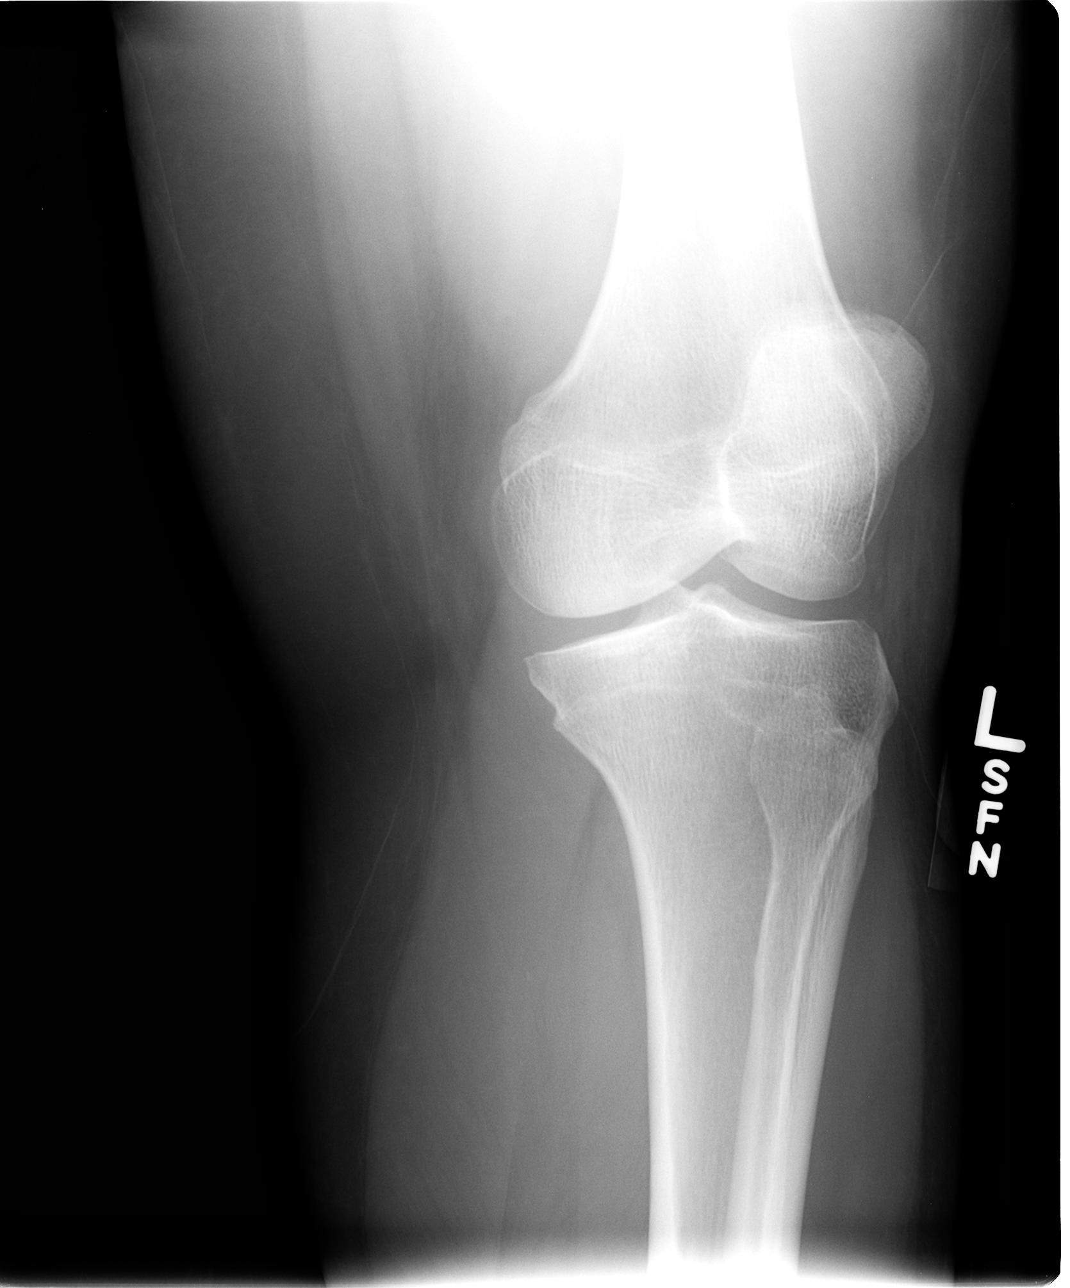

[view not recorded (4 of 4)]
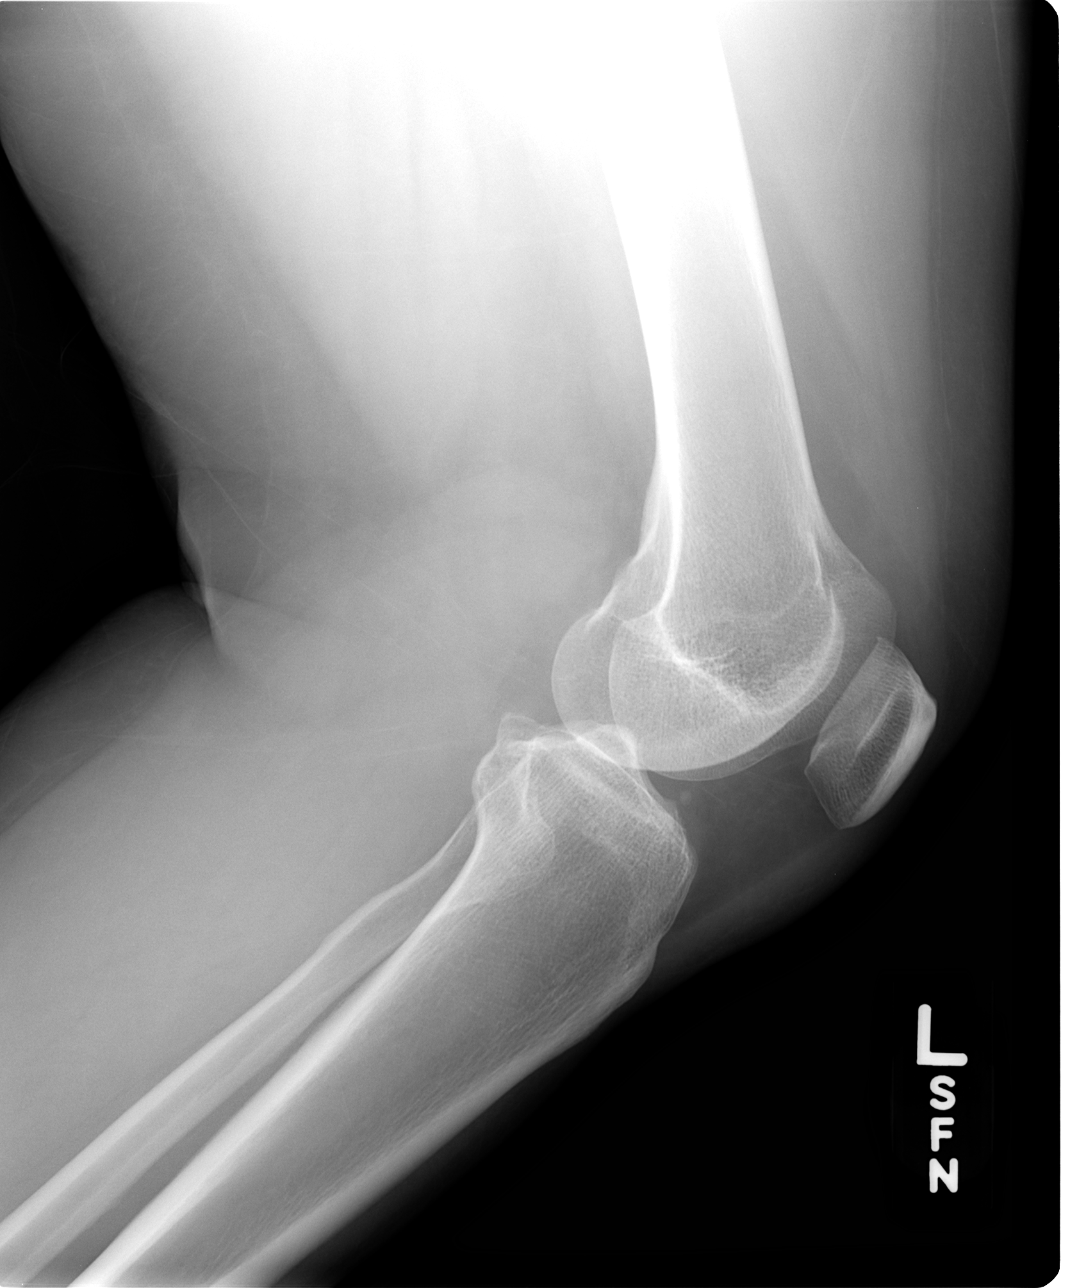

[4 of 4 positions shown; findings below may reference images not displayed]

FINDINGS: No fracture or dislocation is seen.

The joint spaces are essentially preserved.  Minimal patellofemoral
osteophytosis.

Possible tiny well corticated loose body within the medial joint
space.

Small suprapatellar knee joint effusion.
IMPRESSION: No fracture or dislocation is seen.

Small suprapatellar knee joint effusion.

Possible tiny loose body within the medial joint space.

## 2014-08-21 ENCOUNTER — Emergency Department (HOSPITAL_COMMUNITY): Payer: Medicaid Other

## 2014-08-21 ENCOUNTER — Encounter (HOSPITAL_COMMUNITY): Payer: Self-pay | Admitting: Emergency Medicine

## 2014-08-21 ENCOUNTER — Emergency Department (HOSPITAL_COMMUNITY)
Admission: EM | Admit: 2014-08-21 | Discharge: 2014-08-21 | Disposition: A | Discharge status: Home / Self Care | Payer: Medicaid Other | Attending: Emergency Medicine | Admitting: Emergency Medicine

## 2014-08-21 DIAGNOSIS — I10 Essential (primary) hypertension: Secondary | ICD-10-CM | POA: Diagnosis not present

## 2014-08-21 DIAGNOSIS — Z880 Allergy status to penicillin: Secondary | ICD-10-CM | POA: Insufficient documentation

## 2014-08-21 DIAGNOSIS — Z791 Long term (current) use of non-steroidal anti-inflammatories (NSAID): Secondary | ICD-10-CM | POA: Insufficient documentation

## 2014-08-21 DIAGNOSIS — Z8701 Personal history of pneumonia (recurrent): Secondary | ICD-10-CM | POA: Diagnosis not present

## 2014-08-21 DIAGNOSIS — Z794 Long term (current) use of insulin: Secondary | ICD-10-CM | POA: Insufficient documentation

## 2014-08-21 DIAGNOSIS — Z872 Personal history of diseases of the skin and subcutaneous tissue: Secondary | ICD-10-CM | POA: Diagnosis not present

## 2014-08-21 DIAGNOSIS — Z87448 Personal history of other diseases of urinary system: Secondary | ICD-10-CM | POA: Diagnosis not present

## 2014-08-21 DIAGNOSIS — Z720 Tobacco use: Secondary | ICD-10-CM | POA: Insufficient documentation

## 2014-08-21 DIAGNOSIS — Z8719 Personal history of other diseases of the digestive system: Secondary | ICD-10-CM | POA: Diagnosis not present

## 2014-08-21 DIAGNOSIS — M722 Plantar fascial fibromatosis: Secondary | ICD-10-CM | POA: Diagnosis not present

## 2014-08-21 DIAGNOSIS — Z8709 Personal history of other diseases of the respiratory system: Secondary | ICD-10-CM | POA: Insufficient documentation

## 2014-08-21 DIAGNOSIS — Z8659 Personal history of other mental and behavioral disorders: Secondary | ICD-10-CM | POA: Diagnosis not present

## 2014-08-21 DIAGNOSIS — M79672 Pain in left foot: Secondary | ICD-10-CM | POA: Diagnosis present

## 2014-08-21 DIAGNOSIS — E119 Type 2 diabetes mellitus without complications: Secondary | ICD-10-CM | POA: Insufficient documentation

## 2014-08-21 DIAGNOSIS — Z8619 Personal history of other infectious and parasitic diseases: Secondary | ICD-10-CM | POA: Diagnosis not present

## 2014-08-21 MED ORDER — IBUPROFEN 800 MG PO TABS
800.0000 mg | ORAL_TABLET | Freq: Once | ORAL | Status: AC
  Administered 2014-08-21: 800 mg via ORAL
  Filled 2014-08-21: qty 1

## 2014-08-21 MED ORDER — HYDROCODONE-ACETAMINOPHEN 5-325 MG PO TABS: ORAL_TABLET | ORAL | Status: DC

## 2014-08-21 MED ORDER — NAPROXEN 500 MG PO TABS: 500.0000 mg | ORAL_TABLET | Freq: Two times a day (BID) | ORAL | Status: DC

## 2014-08-21 NOTE — ED Notes (Signed)
Pain lt foot, plantar surface.near 4th and 5 th toes.  No known injury.

## 2014-08-21 NOTE — ED Notes (Signed)
Onset yesterday, sharp pain in bottom of left foot when walking. Today constant pain

## 2014-08-21 NOTE — Discharge Instructions (Signed)
Plantar Fasciitis  Plantar fasciitis is a common condition that causes foot pain. It is soreness (inflammation) of the band of tough fibrous tissue on the bottom of the foot that runs from the heel bone (calcaneus) to the ball of the foot. The cause of this soreness may be from excessive standing, poor fitting shoes, running on hard surfaces, being overweight, having an abnormal walk, or overuse (this is common in runners) of the painful foot or feet. It is also common in aerobic exercise dancers and ballet dancers.  SYMPTOMS   Most people with plantar fasciitis complain of:   Severe pain in the morning on the bottom of their foot especially when taking the first steps out of bed. This pain recedes after a few minutes of walking.   Severe pain is experienced also during walking following a long period of inactivity.   Pain is worse when walking barefoot or up stairs  DIAGNOSIS    Your caregiver will diagnose this condition by examining and feeling your foot.   Special tests such as X-rays of your foot, are usually not needed.  PREVENTION    Consult a sports medicine professional before beginning a new exercise program.   Walking programs offer a good workout. With walking there is a lower chance of overuse injuries common to runners. There is less impact and less jarring of the joints.   Begin all new exercise programs slowly. If problems or pain develop, decrease the amount of time or distance until you are at a comfortable level.   Wear good shoes and replace them regularly.   Stretch your foot and the heel cords at the back of the ankle (Achilles tendon) both before and after exercise.   Run or exercise on even surfaces that are not hard. For example, asphalt is better than pavement.   Do not run barefoot on hard surfaces.   If using a treadmill, vary the incline.   Do not continue to workout if you have foot or joint problems. Seek professional help if they do not improve.  HOME CARE INSTRUCTIONS     Avoid activities that cause you pain until you recover.   Use ice or cold packs on the problem or painful areas after working out.   Only take over-the-counter or prescription medicines for pain, discomfort, or fever as directed by your caregiver.   Soft shoe inserts or athletic shoes with air or gel sole cushions may be helpful.   If problems continue or become more severe, consult a sports medicine caregiver or your own health care provider. Cortisone is a potent anti-inflammatory medication that may be injected into the painful area. You can discuss this treatment with your caregiver.  MAKE SURE YOU:    Understand these instructions.   Will watch your condition.   Will get help right away if you are not doing well or get worse.  Document Released: 02/28/2001 Document Revised: 08/28/2011 Document Reviewed: 04/29/2008  ExitCare Patient Information 2015 [employer]. This information is not intended to replace advice given to you by your health care provider. Make sure you discuss any questions you have with your health care provider.

## 2014-08-23 NOTE — ED Provider Notes (Signed)
CSN: [id number]     Arrival date & time 08/21/14  1951 History   First MD Initiated Contact with Patient 08/21/14 2040     Chief Complaint  Patient presents with  . Foot Pain     (Consider location/radiation/quality/duration/timing/severity/associated sxs/prior Treatment) HPI   Connie Aguilar is a 39 y.o. female who presents to the Emergency Department complaining of left foot pain.  She describes a sharp pain to the bottom surface of her foot that began one Neyra prior to ED arrival.  She states that she has been walking more recently, but denies known injury.  Pain is worse with weight bearing.  She has not tried any therpies.  She denies open wound, swelling, redness or numbness.   Past Medical History  Diagnosis Date  . Diabetes mellitus   . GERD (gastroesophageal reflux disease)   . Hypertension   . Bronchitis     hx of as child  . PONV (postoperative nausea and vomiting)   . Pneumonia     hx of as child  . BV (bacterial vaginosis)   . Diabetes 09/19/2012  . Contraception management 09/19/2012  . Vaginal discharge 05/20/2013  . Vaginal itching 05/20/2013  . Mood swings   . Abnormal uterine bleeding (AUB) 12/31/2013   Past Surgical History  Procedure Laterality Date  . Wisdom tooth extraction    . Pars plana vitrectomy  09/13/2011    Procedure: PARS PLANA VITRECTOMY WITH 25 GAUGE;  Surgeon: Edmon Crape, MD;  Location: [facility] OR;  Service: Ophthalmology;  Laterality: Right;  . Pars plana vitrectomy  10/27/2011    Procedure: PARS PLANA VITRECTOMY WITH 25 GAUGE;  Surgeon: Edmon Crape, MD;  Location: [facility] OR;  Service: Ophthalmology;  Laterality: Right;  . Eye surgery  march 2013, laser prp  OU    vitrectomy  . Cataract extraction w/phaco  11/27/2011    Procedure: CATARACT EXTRACTION PHACO AND INTRAOCULAR LENS PLACEMENT (IOC);  Surgeon: Gemma Payor, MD;  Location: [facility];  Service: Ophthalmology;  Laterality: Left;  CDE:3.02  . Pars plana vitrectomy  02/16/2012    Procedure: PARS PLANA  VITRECTOMY WITH 25 GAUGE;  Surgeon: Edmon Crape, MD;  Location: [facility] OR;  Service: Ophthalmology;  Laterality: Left;  Repair Retinal Detachment  . Gas/fluid exchange  02/16/2012    Procedure: GAS/FLUID EXCHANGE;  Surgeon: Edmon Crape, MD;  Location: [facility] OR;  Service: Ophthalmology;  Laterality: Left;  SF6  . Cesarean section     Family History  Problem Relation Age of Onset  . Cancer Mother 14    breast  . Hypertension Mother   . Glaucoma Father   . Diabetes Sister   . Anesthesia problems Neg Hx   . Hypotension Neg Hx   . Malignant hyperthermia Neg Hx   . Pseudochol deficiency Neg Hx   . Diabetes Maternal Grandmother   . Cancer Maternal Grandfather     prostate  . Diabetes Paternal Grandmother   . Diabetes Paternal Grandfather   . Diabetes Sister   . Seizures Daughter   . Bronchitis Brother    History  Substance Use Topics  . Smoking status: Current Some [place] Smoker -- 0.25 packs/[place] for 15 years    Types: Cigarettes  . Smokeless tobacco: Never Used  . Alcohol Use: Yes     Comment: occ- <1 a week, wine, beer or mixed drink   OB History    Gravida Para Term Preterm AB TAB SAB Ectopic Multiple Living   1 1  1      1     Review of Systems  Constitutional: Negative for fever and chills.  Gastrointestinal: Negative for nausea and vomiting.  Musculoskeletal: Positive for arthralgias (left foot pain). Negative for joint swelling.  Skin: Negative for color change and wound.  Neurological: Negative for weakness and numbness.  All other systems reviewed and are negative.     Allergies  Food and Penicillins  Home Medications   Prior to Admission medications   Medication Sig Start Date End Date Taking? Authorizing Provider  acetaminophen (TYLENOL) 500 MG tablet Take 1,000 mg by mouth every 6 (six) hours as needed. Pain   Yes Historical Provider, MD  atorvastatin (LIPITOR) 20 MG tablet Take 20 mg by mouth at bedtime. 08/11/14  Yes Historical Provider, MD  insulin glargine  (LANTUS) 100 UNIT/ML injection Inject 30 Units into the skin at bedtime.    Yes Historical Provider, MD  insulin lispro (HUMALOG) 100 UNIT/ML injection Inject 8 Units into the skin 3 (three) times daily before meals. +SLIDING SCALE   Yes Historical Provider, MD  loratadine (CLARITIN) 10 MG tablet Take 10 mg by mouth daily as needed for allergies.   Yes Historical Provider, MD  losartan (COZAAR) 50 MG tablet Take 50 mg by mouth daily.   Yes Historical Provider, MD  metFORMIN (GLUCOPHAGE) 500 MG tablet Take 500 mg by mouth 2 (two) times daily. 08/05/14  Yes Historical Provider, MD  metoprolol succinate (TOPROL-XL) 100 MG 24 hr tablet Take 100 mg by mouth daily. Take with or immediately following a meal.   Yes Historical Provider, MD  norethindrone (MICRONOR,CAMILA,ERRIN) 0.35 MG tablet TAKE 1 TABLET BY MOUTH DAILY 02/03/14  Yes Adline Potter, NP  PARoxetine (PAXIL) 20 MG tablet Take 20 mg by mouth at bedtime.    Yes Historical Provider, MD  HYDROcodone-acetaminophen (NORCO/VICODIN) 5-325 MG per tablet Take one tab po q 4-6 hrs prn pain 08/21/14   Jazen Spraggins L. Osha Rane, PA-C  naproxen (NAPROSYN) 500 MG tablet Take 1 tablet (500 mg total) by mouth 2 (two) times daily with a meal. 08/21/14   Jaydin Jalomo L. Rubena Roseman, PA-C   BP 132/74 mmHg  Pulse 89  Temp(Src) 99 F (37.2 C) (Oral)  Resp 20  Ht  (1.549 m)  Wt 214 lb (97.07 kg)  BMI 40.46 kg/m2  SpO2 100%  LMP 07/29/2014 Physical Exam  Constitutional: She is oriented to person, place, and time. She appears well-developed and well-nourished. No distress.  HENT:  Head: Normocephalic and atraumatic.  Cardiovascular: Normal rate, regular rhythm, normal heart sounds and intact distal pulses.   Pulmonary/Chest: Effort normal and breath sounds normal.  Musculoskeletal: She exhibits tenderness. She exhibits no edema.  ttp of the plantar surface of the distal left foot.  ROM is preserved.  DP pulse is brisk,distal sensation intact.  No erythema, abrasion,  bruising or bony deformity.  No proximal tenderness.  Neurological: She is alert and oriented to person, place, and time. She exhibits normal muscle tone. Coordination normal.  Skin: Skin is warm and dry.  Nursing note and vitals reviewed.   ED Course  Procedures (including critical care time) Labs Review Labs Reviewed - No data to display  Imaging Review Dg Foot Complete Left  08/21/2014   CLINICAL DATA:  Left foot pain and swelling since yesterday with no known injury.  EXAM: LEFT FOOT - COMPLETE 3+ VIEW  COMPARISON:  None.  FINDINGS: There is no evidence of fracture or dislocation. There is no evidence of arthropathy  or other focal bone abnormality. Soft tissues are unremarkable.  IMPRESSION: Negative.   Electronically Signed   By: Marnee SpringJonathon  Watts M.D.   On: 08/21/2014 21:25     EKG Interpretation None      MDM   Final diagnoses:  Plantar fasciitis of left foot    Xray negative for acute injury.  No clinical concern for cellulitis.   Pt agrees to symptomatic tx and PMD follow-up   Brenleigh Collet L. Trisha Mangleriplett, PA-C 08/23/14 1335  Benny LennertJoseph L Zammit, MD 08/23/14 1535

## 2014-08-31 ENCOUNTER — Telehealth: Payer: Self-pay | Admitting: Orthopedic Surgery

## 2014-08-31 NOTE — Telephone Encounter (Signed)
Patient called today, 08/31/14, following Emergency Room visit at [facility] on 08/21/14 for appointment regarding problem of left foot pain; Xrays had been done at that time.  Offered appointment upon receipt of referral by primary care physician, per insurance requirement. Patient to contact primary care, Dr Margo Commonapper.  Her ph# is [phone].

## 2014-09-03 NOTE — Telephone Encounter (Signed)
Appointment is scheduled; patient aware. °

## 2014-09-23 ENCOUNTER — Ambulatory Visit (INDEPENDENT_AMBULATORY_CARE_PROVIDER_SITE_OTHER): Payer: PRIVATE HEALTH INSURANCE | Admitting: Orthopedic Surgery

## 2014-09-23 ENCOUNTER — Encounter: Payer: Self-pay | Admitting: Orthopedic Surgery

## 2014-09-23 VITALS — BP 122/79 | Ht 61.0 in | Wt 214.0 lb

## 2014-09-23 DIAGNOSIS — S93602A Unspecified sprain of left foot, initial encounter: Secondary | ICD-10-CM | POA: Diagnosis not present

## 2014-09-23 NOTE — Patient Instructions (Signed)
Orthotics from [facility]

## 2014-09-23 NOTE — Progress Notes (Signed)
   Subjective:    Patient ID: Connie Aguilar, female    DOB: 02-14-1976, 39 y.o.   MRN: [id number] Chief Complaint  Patient presents with  . Foot Pain    ER follow up, left foot pain, referred by Dr. Margo Commonapper.    Foot Pain   this 39 year-old female with walking for exercise when opening inclined turned her foot felt acute pain swelling and eventually had x-rays which were negative. She now complains of dorsal foot pain mild lateral ankle pain and painful weightbearing although she notes that she is getting better. She denies any catching locking or giving way    Review of Systems Night sweats were reported, but no evidence of GI discomforts, genitourinary problems.    Objective:   Physical Exam BP 122/79 mmHg  Ht 5\' 1"  (1.549 m)  Wt 214 lb (97.07 kg)  BMI 40.46 kg/m2 Appearance is normal. Oriented 3 Mood pleasant Gait and station normal Left foot tenderness at the midfoot and anterior talofibular ligament Normal passive ROM at the metatarsophalangeal joints and ankle joint with  Stability at the TMT joint is normal. Motor exam normal. Skin normal sensation normal pulses normal       Assessment & Plan:  I reviewed her x-rays today were negative for fracture  Treat for sprain she is getting better recommend Spenco Warm'N'Form orthotics for 6 weeks in follow-up

## 2014-09-25 ENCOUNTER — Ambulatory Visit: Payer: Medicaid Other | Admitting: Nutrition

## 2014-11-05 ENCOUNTER — Encounter: Payer: PRIVATE HEALTH INSURANCE | Attending: "Endocrinology | Admitting: Nutrition

## 2014-11-05 ENCOUNTER — Encounter: Payer: Self-pay | Admitting: Nutrition

## 2014-11-05 ENCOUNTER — Ambulatory Visit (INDEPENDENT_AMBULATORY_CARE_PROVIDER_SITE_OTHER): Payer: PRIVATE HEALTH INSURANCE | Admitting: Orthopedic Surgery

## 2014-11-05 VITALS — Ht 61.0 in | Wt 221.0 lb

## 2014-11-05 VITALS — BP 138/85 | Ht 61.0 in | Wt 221.0 lb

## 2014-11-05 DIAGNOSIS — E108 Type 1 diabetes mellitus with unspecified complications: Secondary | ICD-10-CM | POA: Diagnosis not present

## 2014-11-05 DIAGNOSIS — S93602A Unspecified sprain of left foot, initial encounter: Secondary | ICD-10-CM

## 2014-11-05 DIAGNOSIS — Z6841 Body Mass Index (BMI) 40.0 and over, adult: Secondary | ICD-10-CM | POA: Insufficient documentation

## 2014-11-05 DIAGNOSIS — Z713 Dietary counseling and surveillance: Secondary | ICD-10-CM | POA: Insufficient documentation

## 2014-11-05 DIAGNOSIS — Z794 Long term (current) use of insulin: Secondary | ICD-10-CM | POA: Insufficient documentation

## 2014-11-05 DIAGNOSIS — E669 Obesity, unspecified: Secondary | ICD-10-CM | POA: Diagnosis not present

## 2014-11-05 NOTE — Progress Notes (Signed)
  Medical Nutrition Therapy:  Appt start time: 0800 end time:  0900.  Assessment:  Primary concerns today: Diabetes Type 1 vs 2 ?Connie Aguilar. LIves with her mom and kids. She does the cooking and shopping for her and her child. Mostly baked. She has cut back on frying foods.Most recent A1C 9.8% down from 13%. Physical acting: walking 45 mins 2-3 times per week.   Levemir 30 units daily and 5 units with meals plus sliding scale. PMH: Hyperlipidemia, HTN, DM and obesity. Diet is excessive in CHO at certain meals. Meals are inconsistent at times. She admits to eating between meals at times. She injects in legs, arms and abdomen. Need more low fiber vegetables to help with satiety and fullness and consume less carbs at certain meals.    Preferred Learning Style:   Visual  Learning Readiness:   Ready  Change in progress   MEDICATIONS: none   DIETARY INTAKE:  24-hr recall:  B ( AM): [place] bacon, egg whites-1-2. water  Snk ( AM): none  L ( PM): Chicken corn chowder, 1 cup, Tuna fish sandwich on wheat roll, water Snk ( PM):  D ( PM): Broccoli and [place]key burger with bun, water Snk ( PM): sometimes eats pb, chips, or misc. Beverages: water,  Usual physical activity: None  Estimated energy needs: 1500 calories 170g carbohydrates 112 g protein 42 g fat  Progress Towards Goal(s):  In progress.   Nutritional Diagnosis:  NB-1.1 Food and nutrition-related knowledge deficit As related to Diabetes.  As evidenced by A1C 9.8%.    Intervention:  Nutrition counseling and diabetes education provided on diet, meal planning, portion sizes, My Plate, CHO Counting, target ranges for blood sugars, treatment of hyper/hypoglycemia and treatment of both and prevention of complications and low fat low sodium high fiber diet.  Goals: 1. Eat three meals per [place]. 2. Follow The Plate Method. 3. Cut out snacks, sweets, misc. Junk food, sodas, tea, juices, ice cream etc. 4. Increase water intake. 5. Exercise  for 150 mins weekly. 6. Don't skip meals and don't forget to take insulin with meals. 7. Get A1C down to 8% in three months. 8. Lose 1 lb per week til next visit. 9. Inject insulin in stomach area for best absorption.  Teaching Method Utilized:  Visual Auditory Hands on  Handouts given during visit include:  The Plate Method  Meal Plan Card  Barriers to learning/adherence to lifestyle change: none  Demonstrated degree of understanding via:  Teach Back   Monitoring/Evaluation:  Dietary intake, exercise, meal planning, SBG, and body weight in 1 month(s).

## 2014-11-05 NOTE — Progress Notes (Signed)
Patient ID: Connie Aguilar, female   DOB: [birth year], 39 y.o.   MRN: [id number] Follow-up visit status post sprain left foot. Patient treated with Spenco orthotics. Patient's pain has resolved.  The orthotics are fitting well. Her clinical exam revealed no palpable tenderness mild pes planus flexible  Patient can return as needed call for reordering orthotics when needed.

## 2014-11-11 NOTE — Patient Instructions (Signed)
  Goals: 1. Eat three meals per [place]. 2. Follow The Plate Method. 3. Cut out snacks, sweets, misc. Junk food, sodas, tea, juices, ice cream etc. 4. Increase water intake. 5. Exercise for 150 mins weekly. 6. Don't skip meals and don't forget to take insulin with meals. 7. Get A1C down to 8% in three months. 8. Lose 1 lb per week til next visit. 9. Inject insulin in stomach area for best absorption.

## 2014-12-05 ENCOUNTER — Encounter (HOSPITAL_COMMUNITY): Payer: Self-pay | Admitting: Emergency Medicine

## 2014-12-05 ENCOUNTER — Emergency Department (HOSPITAL_COMMUNITY)
Admission: EM | Admit: 2014-12-05 | Discharge: 2014-12-05 | Disposition: A | Discharge status: Home / Self Care | Payer: PRIVATE HEALTH INSURANCE | Attending: Emergency Medicine | Admitting: Emergency Medicine

## 2014-12-05 DIAGNOSIS — Z8719 Personal history of other diseases of the digestive system: Secondary | ICD-10-CM | POA: Insufficient documentation

## 2014-12-05 DIAGNOSIS — Z872 Personal history of diseases of the skin and subcutaneous tissue: Secondary | ICD-10-CM | POA: Diagnosis not present

## 2014-12-05 DIAGNOSIS — Z8659 Personal history of other mental and behavioral disorders: Secondary | ICD-10-CM | POA: Diagnosis not present

## 2014-12-05 DIAGNOSIS — E119 Type 2 diabetes mellitus without complications: Secondary | ICD-10-CM | POA: Insufficient documentation

## 2014-12-05 DIAGNOSIS — Z8701 Personal history of pneumonia (recurrent): Secondary | ICD-10-CM | POA: Insufficient documentation

## 2014-12-05 DIAGNOSIS — Z79899 Other long term (current) drug therapy: Secondary | ICD-10-CM | POA: Insufficient documentation

## 2014-12-05 DIAGNOSIS — Z794 Long term (current) use of insulin: Secondary | ICD-10-CM | POA: Insufficient documentation

## 2014-12-05 DIAGNOSIS — Z880 Allergy status to penicillin: Secondary | ICD-10-CM | POA: Insufficient documentation

## 2014-12-05 DIAGNOSIS — I10 Essential (primary) hypertension: Secondary | ICD-10-CM | POA: Diagnosis not present

## 2014-12-05 DIAGNOSIS — R05 Cough: Secondary | ICD-10-CM | POA: Diagnosis present

## 2014-12-05 DIAGNOSIS — E785 Hyperlipidemia, unspecified: Secondary | ICD-10-CM | POA: Diagnosis not present

## 2014-12-05 DIAGNOSIS — Z720 Tobacco use: Secondary | ICD-10-CM | POA: Diagnosis not present

## 2014-12-05 DIAGNOSIS — Z8742 Personal history of other diseases of the female genital tract: Secondary | ICD-10-CM | POA: Insufficient documentation

## 2014-12-05 DIAGNOSIS — J020 Streptococcal pharyngitis: Secondary | ICD-10-CM | POA: Insufficient documentation

## 2014-12-05 LAB — RAPID STREP SCREEN (MED CTR MEBANE ONLY): STREPTOCOCCUS, GROUP A SCREEN (DIRECT): POSITIVE — AB

## 2014-12-05 MED ORDER — AZITHROMYCIN 250 MG PO TABS
500.0000 mg | ORAL_TABLET | Freq: Once | ORAL | Status: AC
  Administered 2014-12-05: 500 mg via ORAL
  Filled 2014-12-05: qty 2

## 2014-12-05 MED ORDER — PREDNISONE 50 MG PO TABS
60.0000 mg | ORAL_TABLET | Freq: Once | ORAL | Status: AC
  Administered 2014-12-05: 60 mg via ORAL
  Filled 2014-12-05 (×2): qty 1

## 2014-12-05 MED ORDER — AZITHROMYCIN 250 MG PO TABS: ORAL_TABLET | ORAL | Status: DC

## 2014-12-05 NOTE — ED Provider Notes (Signed)
CSN: [id number]     Arrival date & time 12/05/14  0414 History   First MD Initiated Contact with Patient 12/05/14 0445     Chief Complaint  Patient presents with  . Sore Throat  . Cough   Patient is a 39 y.o. female presenting with pharyngitis. The history is provided by the patient.  Sore Throat This is a new problem. The current episode started more than 2 days ago. The problem occurs daily. The problem has been gradually worsening. The symptoms are aggravated by swallowing. The symptoms are relieved by rest.  pt reports ST for 4 days She reports cough started tonight She reports "low grade temp" at home No vomiting   Past Medical History  Diagnosis Date  . Diabetes mellitus   . GERD (gastroesophageal reflux disease)   . Hypertension   . Bronchitis     hx of as child  . PONV (postoperative nausea and vomiting)   . Pneumonia     hx of as child  . BV (bacterial vaginosis)   . Diabetes 09/19/2012  . Contraception management 09/19/2012  . Vaginal discharge 05/20/2013  . Vaginal itching 05/20/2013  . Mood swings   . Abnormal uterine bleeding (AUB) 12/31/2013  . Hyperlipidemia    Past Surgical History  Procedure Laterality Date  . Wisdom tooth extraction    . Pars plana vitrectomy  09/13/2011    Procedure: PARS PLANA VITRECTOMY WITH 25 GAUGE;  Surgeon: Edmon Crape, MD;  Location: [facility] OR;  Service: Ophthalmology;  Laterality: Right;  . Pars plana vitrectomy  10/27/2011    Procedure: PARS PLANA VITRECTOMY WITH 25 GAUGE;  Surgeon: Edmon Crape, MD;  Location: [facility] OR;  Service: Ophthalmology;  Laterality: Right;  . Eye surgery  march 2013, laser prp  OU    vitrectomy  . Cataract extraction w/phaco  11/27/2011    Procedure: CATARACT EXTRACTION PHACO AND INTRAOCULAR LENS PLACEMENT (IOC);  Surgeon: Gemma Payor, MD;  Location: [facility];  Service: Ophthalmology;  Laterality: Left;  CDE:3.02  . Pars plana vitrectomy  02/16/2012    Procedure: PARS PLANA VITRECTOMY WITH 25 GAUGE;  Surgeon: Edmon Crape, MD;  Location: [facility] OR;  Service: Ophthalmology;  Laterality: Left;  Repair Retinal Detachment  . Gas/fluid exchange  02/16/2012    Procedure: GAS/FLUID EXCHANGE;  Surgeon: Edmon Crape, MD;  Location: [facility] OR;  Service: Ophthalmology;  Laterality: Left;  SF6  . Cesarean section     Family History  Problem Relation Age of Onset  . Cancer Mother 35    breast  . Hypertension Mother   . Glaucoma Father   . Diabetes Sister   . Anesthesia problems Neg Hx   . Hypotension Neg Hx   . Malignant hyperthermia Neg Hx   . Pseudochol deficiency Neg Hx   . Diabetes Maternal Grandmother   . Cancer Maternal Grandfather     prostate  . Diabetes Paternal Grandmother   . Diabetes Paternal Grandfather   . Diabetes Sister   . Seizures Daughter   . Bronchitis Brother    History  Substance Use Topics  . Smoking status: Current Some [place] Smoker -- 0.25 packs/[place] for 15 years    Types: Cigarettes  . Smokeless tobacco: Never Used  . Alcohol Use: Yes     Comment: occ- <1 a week, wine, beer or mixed drink   OB History    Gravida Para Term Preterm AB TAB SAB Ectopic Multiple Living   1 1  1  1     Review of Systems  Constitutional: Positive for fever.  HENT: Positive for sore throat.   Gastrointestinal: Negative for vomiting.      Allergies  Food and Penicillins  Home Medications   Prior to Admission medications   Medication Sig Start Date End Date Taking? Authorizing Provider  atorvastatin (LIPITOR) 20 MG tablet Take 20 mg by mouth at bedtime. 08/11/14  Yes Historical Provider, MD  insulin glargine (LANTUS) 100 UNIT/ML injection Inject 30 Units into the skin at bedtime.    Yes Historical Provider, MD  insulin lispro (HUMALOG) 100 UNIT/ML injection Inject 8 Units into the skin 3 (three) times daily before meals. +SLIDING SCALE   Yes Historical Provider, MD  loratadine (CLARITIN) 10 MG tablet Take 10 mg by mouth daily as needed for allergies.   Yes Historical Provider, MD  losartan  (COZAAR) 50 MG tablet Take 50 mg by mouth daily.   Yes Historical Provider, MD  metFORMIN (GLUCOPHAGE) 500 MG tablet Take 500 mg by mouth 2 (two) times daily. 08/05/14  Yes Historical Provider, MD  metoprolol succinate (TOPROL-XL) 100 MG 24 hr tablet Take 100 mg by mouth daily. Take with or immediately following a meal.   Yes Historical Provider, MD  norethindrone (MICRONOR,CAMILA,ERRIN) 0.35 MG tablet TAKE 1 TABLET BY MOUTH DAILY 02/03/14  Yes Adline Potter, NP  PARoxetine (PAXIL) 20 MG tablet Take 20 mg by mouth at bedtime.     Historical Provider, MD   BP 169/100 mmHg  Pulse 93  Temp(Src) 99.5 F (37.5 C) (Oral)  Resp 18  Ht 5' (1.524 m)  Wt 223 lb (101.152 kg)  BMI 43.55 kg/m2  SpO2 99%  LMP 11/22/2014 Physical Exam CONSTITUTIONAL: Well developed/well nourished, pt watching TV HEAD: Normocephalic/atraumatic EYES: EOMI/PERRL ENMT: Mucous membranes moist, uvula midline, no exudates, mild erythema noted, no stridor, normal phonation, no drooling is noted NECK: supple no meningeal signs, no anterior neck swelling, no anterior lymphadenopathy SPINE/BACK:entire spine nontender CV: S1/S2 noted, no murmurs/rubs/gallops noted LUNGS: Lungs are clear to auscultation bilaterally, no apparent distress ABDOMEN: soft NEURO: Pt is awake/alert/appropriate, moves all extremitiesx4.  No facial droop.   EXTREMITIES: pulses normal/equal, full ROM SKIN: warm, color normal PSYCH: no abnormalities of mood noted, alert and oriented to situation  ED Course  Procedures  Medications  predniSONE (DELTASONE) tablet 60 mg (60 mg Oral Given 12/05/14 0455)    Pt reports sore throat for 4 days She is well appearing, no distress, handling secretions and no drooling She reports diabetes is well controlled Dose of prednisone given for pain management  Labs Review Labs Reviewed  RAPID STREP SCREEN (NOT AT [facility]) - Abnormal; Notable for the following:    Streptococcus, Group A Screen (Direct) POSITIVE (*)     All other components within normal limits      MDM   Final diagnoses:  Strep pharyngitis    Nursing notes including past medical history and social history reviewed and considered in documentation Labs/vital reviewed myself and considered during evaluation     Zadie Rhine, MD 12/05/14 [phone]

## 2014-12-05 NOTE — ED Notes (Signed)
Pt has sore throat x 4 days, started with cough today, using warm salt rinses and ibuprofen, without any relief.

## 2014-12-05 NOTE — Discharge Instructions (Signed)

## 2014-12-14 ENCOUNTER — Other Ambulatory Visit: Payer: Self-pay

## 2014-12-24 ENCOUNTER — Telehealth: Payer: Self-pay | Admitting: Nutrition

## 2014-12-24 ENCOUNTER — Ambulatory Visit: Payer: PRIVATE HEALTH INSURANCE | Admitting: Nutrition

## 2014-12-24 NOTE — Telephone Encounter (Signed)
VM to call back to reschedule missed appointment. PC 

## 2015-02-15 ENCOUNTER — Other Ambulatory Visit: Payer: Self-pay | Admitting: Adult Health

## 2015-03-11 ENCOUNTER — Telehealth: Payer: Self-pay | Admitting: Adult Health

## 2015-03-11 NOTE — Telephone Encounter (Signed)
Left message i called

## 2015-03-12 NOTE — Telephone Encounter (Signed)
Left message I called 

## 2015-03-15 ENCOUNTER — Telehealth: Payer: Self-pay | Admitting: Adult Health

## 2015-03-15 NOTE — Telephone Encounter (Signed)
Stopped POP has not had period yet, if had sex check HPT if no period in 3 months make appt

## 2015-04-13 ENCOUNTER — Encounter: Payer: Self-pay | Admitting: Adult Health

## 2015-04-13 ENCOUNTER — Ambulatory Visit (INDEPENDENT_AMBULATORY_CARE_PROVIDER_SITE_OTHER): Payer: Self-pay | Admitting: Adult Health

## 2015-04-13 VITALS — BP 120/78 | HR 108 | Ht 61.0 in | Wt 220.0 lb

## 2015-04-13 DIAGNOSIS — N760 Acute vaginitis: Secondary | ICD-10-CM

## 2015-04-13 DIAGNOSIS — A499 Bacterial infection, unspecified: Secondary | ICD-10-CM

## 2015-04-13 DIAGNOSIS — N898 Other specified noninflammatory disorders of vagina: Secondary | ICD-10-CM

## 2015-04-13 DIAGNOSIS — B9689 Other specified bacterial agents as the cause of diseases classified elsewhere: Secondary | ICD-10-CM

## 2015-04-13 DIAGNOSIS — L298 Other pruritus: Secondary | ICD-10-CM

## 2015-04-13 LAB — POCT WET PREP (WET MOUNT): WBC WET PREP: POSITIVE

## 2015-04-13 MED ORDER — FLUCONAZOLE 150 MG PO TABS: ORAL_TABLET | ORAL | Status: DC

## 2015-04-13 MED ORDER — METRONIDAZOLE 500 MG PO TABS: 500.0000 mg | ORAL_TABLET | Freq: Two times a day (BID) | ORAL | Status: DC

## 2015-04-13 NOTE — Progress Notes (Signed)
Subjective:     Patient ID: Connie Aguilar, female   DOB: [birth year], 39 y.o.   MRN: [id number]  HPI Connie Aguilar is a 39 year old black female in complaining of vaginal itching and discharge for about 3 weeks, tired OTC monistat without relief.Las sex 3 months ago.No douching or new soaps, but blood sugars have been up.She is self pay today.  Review of Systems + vaginal itching and vaginal discharge,all other systems negative Reviewed past medical,surgical, social and family history. Reviewed medications and allergies.     Objective:   Physical Exam BP 120/78 mmHg  Pulse 108  Ht 5\' 1"  (1.549 m)  Wt 220 lb (99.791 kg)  BMI 41.59 kg/m2  LMP 03/19/2015   Skin warm and dry.Pelvic: external genitalia has excoriation of both labia,white discharge near clitoris , vagina: white discharge without odor,urethra has no lesions or masses noted, cervix:smooth and bulbous, uterus: normal size, shape and contour, non tender, no masses felt, adnexa: no masses or tenderness noted. Bladder is non tender and no masses felt. Wet prep: + for clue cells and +WBCs. She declines HSV2 blood work, but told her this could be possible diagnosis, if not better call me.  Assessment:     Vaginal discharge Vaginal itch BV     Plan:     Rx flagyl 500 mg 1 bid x 7 days, no alcohol, review handout on BV   Rx diflucan 150 mg #2 take 1 now and 1 in 3 days, with 2 refills Can use vaseline 2-3 x daily esp with voiding Follow up prn

## 2015-04-13 NOTE — Patient Instructions (Signed)
Bacterial Vaginosis Bacterial vaginosis is a vaginal infection that occurs when the normal balance of bacteria in the vagina is disrupted. It results from an overgrowth of certain bacteria. This is the most common vaginal infection in women of childbearing age. Treatment is important to prevent complications, especially in pregnant women, as it can cause a premature delivery. CAUSES  Bacterial vaginosis is caused by an increase in harmful bacteria that are normally present in smaller amounts in the vagina. Several different kinds of bacteria can cause bacterial vaginosis. However, the reason that the condition develops is not fully understood. RISK FACTORS Certain activities or behaviors can put you at an increased risk of developing bacterial vaginosis, including:  Having a new sex partner or multiple sex partners.  Douching.  Using an intrauterine device (IUD) for contraception. Women do not get bacterial vaginosis from toilet seats, bedding, swimming pools, or contact with objects around them. SIGNS AND SYMPTOMS  Some women with bacterial vaginosis have no signs or symptoms. Common symptoms include:  Grey vaginal discharge.  A fishlike odor with discharge, especially after sexual intercourse.  Itching or burning of the vagina and vulva.  Burning or pain with urination. DIAGNOSIS  Your health care provider will take a medical history and examine the vagina for signs of bacterial vaginosis. A sample of vaginal fluid may be taken. Your health care provider will look at this sample under a microscope to check for bacteria and abnormal cells. A vaginal pH test may also be done.  TREATMENT  Bacterial vaginosis may be treated with antibiotic medicines. These may be given in the form of a pill or a vaginal cream. A second round of antibiotics may be prescribed if the condition comes back after treatment. Because bacterial vaginosis increases your risk for sexually transmitted diseases, getting  treated can help reduce your risk for chlamydia, gonorrhea, HIV, and herpes. HOME CARE INSTRUCTIONS   Only take over-the-counter or prescription medicines as directed by your health care provider.  If antibiotic medicine was prescribed, take it as directed. Make sure you finish it even if you start to feel better.  Tell all sexual partners that you have a vaginal infection. They should see their health care provider and be treated if they have problems, such as a mild rash or itching.  During treatment, it is important that you follow these instructions:  Avoid sexual activity or use condoms correctly.  Do not douche.  Avoid alcohol as directed by your health care provider.  Avoid breastfeeding as directed by your health care provider. SEEK MEDICAL CARE IF:   Your symptoms are not improving after 3 days of treatment.  You have increased discharge or pain.  You have a fever. MAKE SURE YOU:   Understand these instructions.  Will watch your condition.  Will get help right away if you are not doing well or get worse. FOR MORE INFORMATION  Centers for Disease Control and Prevention, Division of STD Prevention: SolutionApps.co.zawww.cdc.gov/std American Sexual Health Association (ASHA): www.ashastd.org    This information is not intended to replace advice given to you by your health care provider. Make sure you discuss any questions you have with your health care provider.   Document Released: 06/05/2005 Document Revised: 06/26/2014 Document Reviewed: 01/15/2013 Elsevier Interactive Patient Education 2016 Elsevier Inc. No alcohol with flagyl Do not take lipitor with diflucan

## 2015-04-22 ENCOUNTER — Telehealth: Payer: Self-pay | Admitting: *Deleted

## 2015-04-22 NOTE — Telephone Encounter (Signed)
Spoke with pt. Pt was treated for BV and yeast last week. She finished all of her med. She is still having vaginal itching, mainly on the outside. I spoke with JAG and she advised to try OTC Monistat. Advised to let us know if that don't take care of it. Pt voiced understanding. JSY

## 2015-04-26 ENCOUNTER — Telehealth: Payer: Self-pay | Admitting: Adult Health

## 2015-04-26 MED ORDER — VALACYCLOVIR HCL 1 G PO TABS: 1000.0000 mg | ORAL_TABLET | Freq: Two times a day (BID) | ORAL | Status: DC

## 2015-04-26 NOTE — Telephone Encounter (Signed)
Pt wants to try valtrex,will rx

## 2015-05-18 ENCOUNTER — Emergency Department (HOSPITAL_COMMUNITY)
Admission: EM | Admit: 2015-05-18 | Discharge: 2015-05-18 | Disposition: A | Discharge status: Home / Self Care | Payer: Commercial Managed Care - PPO | Attending: Emergency Medicine | Admitting: Emergency Medicine

## 2015-05-18 ENCOUNTER — Encounter (HOSPITAL_COMMUNITY): Payer: Self-pay | Admitting: Emergency Medicine

## 2015-05-18 DIAGNOSIS — Z87891 Personal history of nicotine dependence: Secondary | ICD-10-CM | POA: Diagnosis not present

## 2015-05-18 DIAGNOSIS — L02416 Cutaneous abscess of left lower limb: Secondary | ICD-10-CM | POA: Diagnosis not present

## 2015-05-18 DIAGNOSIS — Z872 Personal history of diseases of the skin and subcutaneous tissue: Secondary | ICD-10-CM | POA: Diagnosis not present

## 2015-05-18 DIAGNOSIS — R000 Tachycardia, unspecified: Secondary | ICD-10-CM | POA: Insufficient documentation

## 2015-05-18 DIAGNOSIS — E119 Type 2 diabetes mellitus without complications: Secondary | ICD-10-CM | POA: Diagnosis not present

## 2015-05-18 DIAGNOSIS — Z8709 Personal history of other diseases of the respiratory system: Secondary | ICD-10-CM | POA: Diagnosis not present

## 2015-05-18 DIAGNOSIS — Z79899 Other long term (current) drug therapy: Secondary | ICD-10-CM | POA: Insufficient documentation

## 2015-05-18 DIAGNOSIS — Z8701 Personal history of pneumonia (recurrent): Secondary | ICD-10-CM | POA: Insufficient documentation

## 2015-05-18 DIAGNOSIS — Z794 Long term (current) use of insulin: Secondary | ICD-10-CM | POA: Diagnosis not present

## 2015-05-18 DIAGNOSIS — Z792 Long term (current) use of antibiotics: Secondary | ICD-10-CM | POA: Diagnosis not present

## 2015-05-18 DIAGNOSIS — E785 Hyperlipidemia, unspecified: Secondary | ICD-10-CM | POA: Diagnosis not present

## 2015-05-18 DIAGNOSIS — Z880 Allergy status to penicillin: Secondary | ICD-10-CM | POA: Diagnosis not present

## 2015-05-18 DIAGNOSIS — Z8719 Personal history of other diseases of the digestive system: Secondary | ICD-10-CM | POA: Diagnosis not present

## 2015-05-18 DIAGNOSIS — I10 Essential (primary) hypertension: Secondary | ICD-10-CM | POA: Diagnosis not present

## 2015-05-18 DIAGNOSIS — Z8742 Personal history of other diseases of the female genital tract: Secondary | ICD-10-CM | POA: Insufficient documentation

## 2015-05-18 MED ORDER — DOXYCYCLINE HYCLATE 100 MG PO CAPS: 100.0000 mg | ORAL_CAPSULE | Freq: Two times a day (BID) | ORAL | Status: DC

## 2015-05-18 MED ORDER — MELOXICAM 15 MG PO TABS: 15.0000 mg | ORAL_TABLET | Freq: Every day | ORAL | Status: DC

## 2015-05-18 MED ORDER — ONDANSETRON HCL 4 MG PO TABS
4.0000 mg | ORAL_TABLET | Freq: Once | ORAL | Status: AC
  Administered 2015-05-18: 4 mg via ORAL
  Filled 2015-05-18: qty 1

## 2015-05-18 MED ORDER — CIPROFLOXACIN HCL 250 MG PO TABS
500.0000 mg | ORAL_TABLET | Freq: Once | ORAL | Status: AC
  Administered 2015-05-18: 500 mg via ORAL
  Filled 2015-05-18: qty 2

## 2015-05-18 MED ORDER — KETOROLAC TROMETHAMINE 10 MG PO TABS
10.0000 mg | ORAL_TABLET | Freq: Once | ORAL | Status: AC
  Administered 2015-05-18: 10 mg via ORAL
  Filled 2015-05-18: qty 1

## 2015-05-18 MED ORDER — HYDROCODONE-ACETAMINOPHEN 5-325 MG PO TABS: 1.0000 | ORAL_TABLET | ORAL | Status: DC | PRN

## 2015-05-18 MED ORDER — DOXYCYCLINE HYCLATE 100 MG PO TABS
100.0000 mg | ORAL_TABLET | Freq: Once | ORAL | Status: AC
  Administered 2015-05-18: 100 mg via ORAL
  Filled 2015-05-18: qty 1

## 2015-05-18 NOTE — ED Provider Notes (Signed)
CSN: [id number]     Arrival date & time 05/18/15  1716 History   First MD Initiated Contact with Patient 05/18/15 1726     Chief Complaint  Patient presents with  . Abscess     (Consider location/radiation/quality/duration/timing/severity/associated sxs/prior Treatment) Patient is a 39 y.o. female presenting with abscess. The history is provided by the patient.  Abscess Location:  Leg Leg abscess location:  L upper leg Abscess quality: painful, redness and warmth   Abscess quality: not draining and not weeping   Red streaking: no   Progression:  Worsening Pain details:    Quality:  Pressure, sharp and shooting   Severity:  Moderate   Timing:  Intermittent   Progression:  Worsening Chronicity:  New Context: diabetes   Relieved by:  Nothing Ineffective treatments:  None tried Associated symptoms: no fever, no nausea and no vomiting   Risk factors: no hx of MRSA     Past Medical History  Diagnosis Date  . Diabetes mellitus   . GERD (gastroesophageal reflux disease)   . Hypertension   . Bronchitis     hx of as child  . PONV (postoperative nausea and vomiting)   . Pneumonia     hx of as child  . BV (bacterial vaginosis)   . Diabetes (HCC) 09/19/2012  . Contraception management 09/19/2012  . Vaginal discharge 05/20/2013  . Vaginal itching 05/20/2013  . Mood swings (HCC)   . Abnormal uterine bleeding (AUB) 12/31/2013  . Hyperlipidemia    Past Surgical History  Procedure Laterality Date  . Wisdom tooth extraction    . Pars plana vitrectomy  09/13/2011    Procedure: PARS PLANA VITRECTOMY WITH 25 GAUGE;  Surgeon: Edmon Crape, MD;  Location: [facility] OR;  Service: Ophthalmology;  Laterality: Right;  . Pars plana vitrectomy  10/27/2011    Procedure: PARS PLANA VITRECTOMY WITH 25 GAUGE;  Surgeon: Edmon Crape, MD;  Location: [facility] OR;  Service: Ophthalmology;  Laterality: Right;  . Eye surgery  march 2013, laser prp  OU    vitrectomy  . Cataract extraction w/phaco  11/27/2011   Procedure: CATARACT EXTRACTION PHACO AND INTRAOCULAR LENS PLACEMENT (IOC);  Surgeon: Gemma Payor, MD;  Location: [facility] OR[facility];  Service: Ophthalmology;  Laterality: Left;  CDE:3.02  . Pars plana vitrectomy  02/16/2012    Procedure: PARS PLANA VITRECTOMY WITH 25 GAUGE;  Surgeon: Edmon Crape, MD;  Location: [facility] OR;  Service: Ophthalmology;  Laterality: Left;  Repair Retinal Detachment  . Gas/fluid exchange  02/16/2012    Procedure: GAS/FLUID EXCHANGE;  Surgeon: Edmon Crape, MD;  Location: [facility] OR;  Service: Ophthalmology;  Laterality: Left;  SF6  . Cesarean section     Family History  Problem Relation Age of Onset  . Cancer Mother 33    breast  . Hypertension Mother   . Glaucoma Father   . Diabetes Sister   . Anesthesia problems Neg Hx   . Hypotension Neg Hx   . Malignant hyperthermia Neg Hx   . Pseudochol deficiency Neg Hx   . Diabetes Maternal Grandmother   . Cancer Maternal Grandfather     prostate  . Diabetes Paternal Grandmother   . Diabetes Paternal Grandfather   . Diabetes Sister   . Seizures Daughter   . Bronchitis Brother    Social History  Substance Use Topics  . Smoking status: Former Smoker -- 0.25 packs/Depierro for 15 years    Types: Cigarettes  . Smokeless tobacco: Never Used  . Alcohol Use:  Yes     Comment: occ- <1 a week, wine, beer or mixed drink   OB History    Gravida Para Term Preterm AB TAB SAB Ectopic Multiple Living   Review of Systems  Constitutional: Negative for fever.  Gastrointestinal: Negative for nausea and vomiting.  Skin: Positive for wound.  All other systems reviewed and are negative.     Allergies  Food and Penicillins  Home Medications   Prior to Admission medications   Medication Sig Start Date End Date Taking? Authorizing Provider  atorvastatin (LIPITOR) 20 MG tablet Take 20 mg by mouth at bedtime. 08/11/14   Historical Provider, MD  fluconazole (DIFLUCAN) 150 MG tablet Take 1 now and 1 in 3 days 04/13/15   Adline Potter, NP  insulin glargine (LANTUS) 100 UNIT/ML injection Inject 30 Units into the skin at bedtime.     Historical Provider, MD  insulin lispro (HUMALOG) 100 UNIT/ML injection Inject 5 Units into the skin 3 (three) times daily before meals. +SLIDING SCALE    Historical Provider, MD  loratadine (CLARITIN) 10 MG tablet Take 10 mg by mouth daily as needed for allergies.    Historical Provider, MD  losartan (COZAAR) 50 MG tablet Take 50 mg by mouth daily.    Historical Provider, MD  metoprolol succinate (TOPROL-XL) 100 MG 24 hr tablet Take 100 mg by mouth daily. Take with or immediately following a meal.    Historical Provider, MD  metroNIDAZOLE (FLAGYL) 500 MG tablet Take 1 tablet (500 mg total) by mouth 2 (two) times daily. 04/13/15   Adline Potter, NP  valACYclovir (VALTREX) 1000 MG tablet Take 1 tablet (1,000 mg total) by mouth 2 (two) times daily. 04/26/15   Adline Potter, NP   BP 149/87 mmHg  Pulse 118  Temp(Src) 98.5 F (36.9 C) (Oral)  Resp 18  Ht  (1.549 m)  Wt 99.791 kg  BMI 41.59 kg/m2  SpO2 99%  LMP 04/17/2015 Physical Exam  Constitutional: She is oriented to person, place, and time. She appears well-developed and well-nourished.  Non-toxic appearance.  HENT:  Head: Normocephalic.  Right Ear: Tympanic membrane and external ear normal.  Left Ear: Tympanic membrane and external ear normal.  Eyes: EOM and lids are normal. Pupils are equal, round, and reactive to light.  Neck: Normal range of motion. Neck supple. Carotid bruit is not present.  Cardiovascular: Regular rhythm, normal heart sounds, intact distal pulses and normal pulses.  Tachycardia present.   Pulmonary/Chest: Breath sounds normal. No respiratory distress.  Abdominal: Soft. Bowel sounds are normal. There is no tenderness. There is no guarding.  Musculoskeletal: Normal range of motion.  Lymphadenopathy:       Head (right side): No submandibular adenopathy present.       Head (left side): No  submandibular adenopathy present.    She has no cervical adenopathy.  Neurological: She is alert and oriented to person, place, and time. She has normal strength. No cranial nerve deficit or sensory deficit.  Skin: Skin is warm and dry.     Psychiatric: She has a normal mood and affect. Her speech is normal.  Nursing note and vitals reviewed.   ED Course  Procedures (including critical care time) Labs Review Labs Reviewed - No data to display  Imaging Review No results found. I have personally reviewed and evaluated these images and lab results as part of my medical decision-making.  EKG Interpretation None      MDM  Patient has a tachycardia of 118, otherwise vital signs are within normal limits. There is a small abscess of the upper inner aspect of the left thigh. There is no red streaks noted. There is no drainage at this time, and there no satellite abscess areas noted. This abscess is not a candidate for incision and drainage at this time. The patient is advised to use warm compresses, doxycycline, and Norco for pain. Patient will also use Mobic for inflammation.    Final diagnoses:  None    *I have reviewed nursing notes, vital signs, and all appropriate lab and imaging results for this patient.Ivery Quale**    Dave Mergen, PA-C 05/18/15 1807  Vanetta MuldersScott Zackowski, MD 05/20/15 1515

## 2015-05-18 NOTE — Discharge Instructions (Signed)
Please soak the abscess area and warm salt water for 15-20 minutes daily until the situation resolves. Please use doxycycline and mobile daily with food. May use Norco for pain if needed. Norco may cause drowsiness, please do not drive, operate machinery, drink alcohol, but dissipated activities requiring concentration when taking this medication. Please see Dr. Margo Commonapper, or return to the emergency department if there is signs of advancing infection, or arterial elevations that would not respond to Tylenol or ibuprofen. Please monitor your glucose carefully. Abscess An abscess (boil or furuncle) is an infected area on or under the skin. This area is filled with yellowish-white fluid (pus) and other material (debris). HOME CARE   Only take medicines as told by your doctor.  If you were given antibiotic medicine, take it as directed. Finish the medicine even if you start to feel better.  If gauze is used, follow your doctor's directions for changing the gauze.  To avoid spreading the infection:  Keep your abscess covered with a bandage.  Wash your hands well.  Do not share personal care items, towels, or whirlpools with others.  Avoid skin contact with others.  Keep your skin and clothes clean around the abscess.  Keep all doctor visits as told. GET HELP RIGHT AWAY IF:   You have more pain, puffiness (swelling), or redness in the wound site.  You have more fluid or blood coming from the wound site.  You have muscle aches, chills, or you feel sick.  You have a fever. MAKE SURE YOU:   Understand these instructions.  Will watch your condition.  Will get help right away if you are not doing well or get worse.   This information is not intended to replace advice given to you by your health care provider. Make sure you discuss any questions you have with your health care provider.   Document Released: 11/22/2007 Document Revised: 12/05/2011 Document Reviewed: 08/19/2011 Elsevier  Interactive Patient Education [employer]2016 Elsevier Inc.

## 2015-05-18 NOTE — ED Notes (Signed)
Pt c/o abscess to LT thigh. Denies drainage. Denies n/v/d. Denies fever or chills.

## 2015-05-22 ENCOUNTER — Emergency Department (HOSPITAL_COMMUNITY)
Admission: EM | Admit: 2015-05-22 | Discharge: 2015-05-23 | Disposition: A | Discharge status: Home / Self Care | Payer: Commercial Managed Care - PPO | Attending: Emergency Medicine | Admitting: Emergency Medicine

## 2015-05-22 ENCOUNTER — Encounter (HOSPITAL_COMMUNITY): Payer: Self-pay | Admitting: Emergency Medicine

## 2015-05-22 DIAGNOSIS — Z8719 Personal history of other diseases of the digestive system: Secondary | ICD-10-CM | POA: Diagnosis not present

## 2015-05-22 DIAGNOSIS — Z791 Long term (current) use of non-steroidal anti-inflammatories (NSAID): Secondary | ICD-10-CM | POA: Insufficient documentation

## 2015-05-22 DIAGNOSIS — I10 Essential (primary) hypertension: Secondary | ICD-10-CM | POA: Diagnosis not present

## 2015-05-22 DIAGNOSIS — Z792 Long term (current) use of antibiotics: Secondary | ICD-10-CM | POA: Insufficient documentation

## 2015-05-22 DIAGNOSIS — J069 Acute upper respiratory infection, unspecified: Secondary | ICD-10-CM | POA: Diagnosis not present

## 2015-05-22 DIAGNOSIS — E119 Type 2 diabetes mellitus without complications: Secondary | ICD-10-CM | POA: Insufficient documentation

## 2015-05-22 DIAGNOSIS — E785 Hyperlipidemia, unspecified: Secondary | ICD-10-CM | POA: Insufficient documentation

## 2015-05-22 DIAGNOSIS — Z87891 Personal history of nicotine dependence: Secondary | ICD-10-CM | POA: Diagnosis not present

## 2015-05-22 DIAGNOSIS — R509 Fever, unspecified: Secondary | ICD-10-CM | POA: Diagnosis present

## 2015-05-22 DIAGNOSIS — Z872 Personal history of diseases of the skin and subcutaneous tissue: Secondary | ICD-10-CM | POA: Insufficient documentation

## 2015-05-22 DIAGNOSIS — Z8701 Personal history of pneumonia (recurrent): Secondary | ICD-10-CM | POA: Diagnosis not present

## 2015-05-22 DIAGNOSIS — Z79899 Other long term (current) drug therapy: Secondary | ICD-10-CM | POA: Diagnosis not present

## 2015-05-22 DIAGNOSIS — Z880 Allergy status to penicillin: Secondary | ICD-10-CM | POA: Diagnosis not present

## 2015-05-22 DIAGNOSIS — Z794 Long term (current) use of insulin: Secondary | ICD-10-CM | POA: Diagnosis not present

## 2015-05-22 DIAGNOSIS — Z8742 Personal history of other diseases of the female genital tract: Secondary | ICD-10-CM | POA: Insufficient documentation

## 2015-05-22 LAB — RAPID STREP SCREEN (MED CTR MEBANE ONLY): STREPTOCOCCUS, GROUP A SCREEN (DIRECT): NEGATIVE

## 2015-05-22 NOTE — ED Notes (Signed)
Pt having fever,chills, sore throat, treated for abscess on Wednesday, states that is better.

## 2015-05-23 MED ORDER — GUAIFENESIN-CODEINE 100-10 MG/5ML PO SYRP: 10.0000 mL | ORAL_SOLUTION | Freq: Three times a day (TID) | ORAL | Status: DC | PRN

## 2015-05-23 MED ORDER — MAGIC MOUTHWASH W/LIDOCAINE: 5.0000 mL | Freq: Three times a day (TID) | ORAL | Status: DC | PRN

## 2015-05-23 NOTE — ED Provider Notes (Signed)
CSN: [id number]     Arrival date & time 05/22/15  2237 History   First MD Initiated Contact with Patient 05/22/15 2305     Chief Complaint  Patient presents with  . Fever     (Consider location/radiation/quality/duration/timing/severity/associated sxs/prior Treatment) HPI   Connie Aguilar is a 39 y.o. female who presents to the Emergency Department complaining of myalgias, sore throat, fever, cough and congestion.  Symptoms began this morning and has progressed throughout the [place].  She reports a max temp of 101.9 earlier today.  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  She also states that she was seen here few days ago and treated for an abscess and is currently taking doxycycline.  She denies vomiting, rash, shortness of breath,  abdominal pain, headaches, neck pain or stiffness.     Past Medical History  Diagnosis Date  . Diabetes mellitus   . GERD (gastroesophageal reflux disease)   . Hypertension   . Bronchitis     hx of as child  . PONV (postoperative nausea and vomiting)   . Pneumonia     hx of as child  . BV (bacterial vaginosis)   . Diabetes (HCC) 09/19/2012  . Contraception management 09/19/2012  . Vaginal discharge 05/20/2013  . Vaginal itching 05/20/2013  . Mood swings (HCC)   . Abnormal uterine bleeding (AUB) 12/31/2013  . Hyperlipidemia    Past Surgical History  Procedure Laterality Date  . Wisdom tooth extraction    . Pars plana vitrectomy  09/13/2011    Procedure: PARS PLANA VITRECTOMY WITH 25 GAUGE;  Surgeon: Edmon Crape, MD;  Location: [facility] OR;  Service: Ophthalmology;  Laterality: Right;  . Pars plana vitrectomy  10/27/2011    Procedure: PARS PLANA VITRECTOMY WITH 25 GAUGE;  Surgeon: Edmon Crape, MD;  Location: [facility] OR;  Service: Ophthalmology;  Laterality: Right;  . Eye surgery  march 2013, laser prp  OU    vitrectomy  . Cataract extraction w/phaco  11/27/2011    Procedure: CATARACT EXTRACTION PHACO AND  INTRAOCULAR LENS PLACEMENT (IOC);  Surgeon: Gemma Payor, MD;  Location: [facility] ORS;  Service: Ophthalmology;  Laterality: Left;  CDE:3.02  . Pars plana vitrectomy  02/16/2012    Procedure: PARS PLANA VITRECTOMY WITH 25 GAUGE;  Surgeon: Edmon Crape, MD;  Location: [facility] OR;  Service: Ophthalmology;  Laterality: Left;  Repair Retinal Detachment  . Gas/fluid exchange  02/16/2012    Procedure: GAS/FLUID EXCHANGE;  Surgeon: Edmon Crape, MD;  Location: [facility] OR;  Service: Ophthalmology;  Laterality: Left;  SF6  . Cesarean section     Family History  Problem Relation Age of Onset  . Cancer Mother 5    breast  . Hypertension Mother   . Glaucoma Father   . Diabetes Sister   . Anesthesia problems Neg Hx   . Hypotension Neg Hx   . Malignant hyperthermia Neg Hx   . Pseudochol deficiency Neg Hx   . Diabetes Maternal Grandmother   . Cancer Maternal Grandfather     prostate  . Diabetes Paternal Grandmother   . Diabetes Paternal Grandfather   . Diabetes Sister   . Seizures Daughter   . Bronchitis Brother    Social History  Substance Use Topics  . Smoking status: Former Smoker -- 0.25 packs/Evers for 15 years    Types: Cigarettes  . Smokeless tobacco: Never Used  . Alcohol Use: Yes     Comment:  occ- <1 a week, wine, beer or mixed drink   OB History    Gravida Para Term Preterm AB TAB SAB Ectopic Multiple Living   Review of Systems  Constitutional: Positive for fever and chills. Negative for activity change and appetite change.  HENT: Positive for congestion, rhinorrhea and sore throat. Negative for facial swelling and trouble swallowing.   Eyes: Negative for visual disturbance.  Respiratory: Positive for cough. Negative for chest tightness, shortness of breath, wheezing and stridor.   Cardiovascular: Negative for chest pain.  Gastrointestinal: Negative for nausea, vomiting and abdominal pain.  Genitourinary: Negative for dysuria and flank pain.  Musculoskeletal: Negative for  neck pain and neck stiffness.  Skin: Negative.  Negative for rash.  Neurological: Negative for dizziness, weakness, numbness and headaches.  Hematological: Negative for adenopathy.  Psychiatric/Behavioral: Negative for confusion.  All other systems reviewed and are negative.     Allergies  Food and Penicillins  Home Medications   Prior to Admission medications   Medication Sig Start Date End Date Taking? Authorizing Provider  atorvastatin (LIPITOR) 20 MG tablet Take 20 mg by mouth at bedtime. 08/11/14   Historical Provider, MD  doxycycline (VIBRAMYCIN) 100 MG capsule Take 1 capsule (100 mg total) by mouth 2 (two) times daily. 05/18/15   Ivery Quale, PA-C  fluconazole (DIFLUCAN) 150 MG tablet Take 1 now and 1 in 3 days 04/13/15   Adline Potter, NP  guaiFENesin-codeine [facility]) 100-10 MG/5ML syrup Take 10 mLs by mouth 3 (three) times daily as needed. 05/23/15   Javyn Havlin, PA-C  HYDROcodone-acetaminophen (NORCO/VICODIN) 5-325 MG tablet Take 1 tablet by mouth every 4 (four) hours as needed. 05/18/15   Ivery Quale, PA-C  insulin glargine (LANTUS) 100 UNIT/ML injection Inject 30 Units into the skin at bedtime.     Historical Provider, MD  insulin lispro (HUMALOG) 100 UNIT/ML injection Inject 5 Units into the skin 3 (three) times daily before meals. +SLIDING SCALE    Historical Provider, MD  loratadine (CLARITIN) 10 MG tablet Take 10 mg by mouth daily as needed for allergies.    Historical Provider, MD  losartan (COZAAR) 50 MG tablet Take 50 mg by mouth daily.    Historical Provider, MD  magic mouthwash w/lidocaine SOLN Take 5 mLs by mouth 3 (three) times daily as needed for mouth pain. Swish and spit, do not swallow 05/23/15   Jaedah Lords, PA-C  meloxicam (MOBIC) 15 MG tablet Take 1 tablet (15 mg total) by mouth daily. 05/18/15   Ivery Quale, PA-C  metoprolol succinate (TOPROL-XL) 100 MG 24 hr tablet Take 100 mg by mouth daily. Take with or immediately following a meal.     Historical Provider, MD  metroNIDAZOLE (FLAGYL) 500 MG tablet Take 1 tablet (500 mg total) by mouth 2 (two) times daily. 04/13/15   Adline Potter, NP  valACYclovir (VALTREX) 1000 MG tablet Take 1 tablet (1,000 mg total) by mouth 2 (two) times daily. 04/26/15   Adline Potter, NP   BP 162/91 mmHg  Pulse 102  Temp(Src) 100.9 F (38.3 C) (Oral)  Resp 20  Ht  (1.549 m)  Wt 101.152 kg  BMI 42.16 kg/m2  SpO2 100%  LMP 04/17/2015 Physical Exam  Constitutional: She is oriented to person, place, and time. She appears well-developed and well-nourished. No distress.  HENT:  Head: Normocephalic and atraumatic.  Right Ear: Tympanic membrane and ear canal normal.  Left  Ear: Tympanic membrane and ear canal normal.  Mouth/Throat: Uvula is midline and mucous membranes are normal. No trismus in the jaw. No uvula swelling. Posterior oropharyngeal erythema present. No oropharyngeal exudate, posterior oropharyngeal edema or tonsillar abscesses.  Neck: Normal range of motion. Neck supple.  Cardiovascular: Normal rate and regular rhythm.   Pulmonary/Chest: Effort normal and breath sounds normal. No respiratory distress. She has no wheezes. She has no rales.  Abdominal: Soft. She exhibits no distension. There is no splenomegaly. There is no tenderness.  Musculoskeletal: Normal range of motion.  Lymphadenopathy:    She has no cervical adenopathy.  Neurological: She is alert and oriented to person, place, and time. She exhibits normal muscle tone. Coordination normal.  Skin: Skin is warm and dry.  Psychiatric: She has a normal mood and affect.  Nursing note and vitals reviewed.   ED Course  Procedures (including critical care time) Labs Review Labs Reviewed  RAPID STREP SCREEN (NOT [facility])  CULTURE, GROUP A STREP    I have personally reviewed and evaluated these images and lab results as part of my medical decision-making.    MDM   Final diagnoses:  URI (upper respiratory  infection)   Pt is well appearing.  Non-toxic.  Airway patent.  Sx's likely viral.  Strep screen neg.  Pt currently taking doxy for an abscess.  She agrees to symptomatic tx and PMD f/u if needed.  Appears stable for d/c     Pauline Ausammy Shalaunda Weatherholtz, PA-C 05/23/15 0108  Zadie Rhineonald Wickline, MD 05/23/15 [id number]

## 2015-05-23 NOTE — Discharge Instructions (Signed)
Upper Respiratory Infection, Adult Most upper respiratory infections (URIs) are a viral infection of the air passages leading to the lungs. A URI affects the nose, throat, and upper air passages. The most common type of URI is nasopharyngitis and is typically referred to as "the common cold." URIs run their course and usually go away on their own. Most of the time, a URI does not require medical attention, but sometimes a bacterial infection in the upper airways can follow a viral infection. This is called a secondary infection. Sinus and middle ear infections are common types of secondary upper respiratory infections. Bacterial pneumonia can also complicate a URI. A URI can worsen asthma and chronic obstructive pulmonary disease (COPD). Sometimes, these complications can require emergency medical care and may be life threatening.  CAUSES Almost all URIs are caused by viruses. A virus is a type of germ and can spread from one person to another.  RISKS FACTORS You may be at risk for a URI if:   You smoke.   You have chronic heart or lung disease.  You have a weakened defense (immune) system.   You are very young or very old.   You have nasal allergies or asthma.  You work in crowded or poorly ventilated areas.  You work in health care facilities or schools. SIGNS AND SYMPTOMS  Symptoms typically develop 2-3 days after you come in contact with a cold virus. Most viral URIs last 7-10 days. However, viral URIs from the influenza virus (flu virus) can last 14-18 days and are typically more severe. Symptoms may include:   Runny or stuffy (congested) nose.   Sneezing.   Cough.   Sore throat.   Headache.   Fatigue.   Fever.   Loss of appetite.   Pain in your forehead, behind your eyes, and over your cheekbones (sinus pain).  Muscle aches.  DIAGNOSIS  Your health care provider may diagnose a URI by:  Physical exam.  Tests to check that your symptoms are not due to  another condition such as:  Strep throat.  Sinusitis.  Pneumonia.  Asthma. TREATMENT  A URI goes away on its own with time. It cannot be cured with medicines, but medicines may be prescribed or recommended to relieve symptoms. Medicines may help:  Reduce your fever.  Reduce your cough.  Relieve nasal congestion. HOME CARE INSTRUCTIONS   Take medicines only as directed by your health care provider.   Gargle warm saltwater or take cough drops to comfort your throat as directed by your health care provider.  Use a warm mist humidifier or inhale steam from a shower to increase air moisture. This may make it easier to breathe.  Drink enough fluid to keep your urine clear or pale yellow.   Eat soups and other clear broths and maintain good nutrition.   Rest as needed.   Return to work when your temperature has returned to normal or as your health care provider advises. You may need to stay home longer to avoid infecting others. You can also use a face mask and careful hand washing to prevent spread of the virus.  Increase the usage of your inhaler if you have asthma.   Do not use any tobacco products, including cigarettes, chewing tobacco, or electronic cigarettes. If you need help quitting, ask your health care provider. PREVENTION  The best way to protect yourself from getting a cold is to practice good hygiene.   Avoid oral or hand contact with people with cold   symptoms.   Wash your hands often if contact occurs.  There is no clear evidence that vitamin C, vitamin E, echinacea, or exercise reduces the chance of developing a cold. However, it is always recommended to get plenty of rest, exercise, and practice good nutrition.  SEEK MEDICAL CARE IF:   You are getting worse rather than better.   Your symptoms are not controlled by medicine.   You have chills.  You have worsening shortness of breath.  You have brown or red mucus.  You have yellow or brown nasal  discharge.  You have pain in your face, especially when you bend forward.  You have a fever.  You have swollen neck glands.  You have pain while swallowing.  You have white areas in the back of your throat. SEEK IMMEDIATE MEDICAL CARE IF:   You have severe or persistent:  Headache.  Ear pain.  Sinus pain.  Chest pain.  You have chronic lung disease and any of the following:  Wheezing.  Prolonged cough.  Coughing up blood.  A change in your usual mucus.  You have a stiff neck.  You have changes in your:  Vision.  Hearing.  Thinking.  Mood. MAKE SURE YOU:   Understand these instructions.  Will watch your condition.  Will get help right away if you are not doing well or get worse.   This information is not intended to replace advice given to you by your health care provider. Make sure you discuss any questions you have with your health care provider.   Document Released: 11/29/2000 Document Revised: 10/20/2014 Document Reviewed: 09/10/2013 Elsevier Interactive Patient Education 2016 Elsevier Inc.  

## 2015-05-24 ENCOUNTER — Encounter: Payer: Self-pay | Admitting: Adult Health

## 2015-05-24 ENCOUNTER — Ambulatory Visit (INDEPENDENT_AMBULATORY_CARE_PROVIDER_SITE_OTHER): Payer: Commercial Managed Care - PPO | Admitting: Adult Health

## 2015-05-24 VITALS — BP 140/80 | HR 86 | Ht 61.0 in | Wt 220.5 lb

## 2015-05-24 DIAGNOSIS — L298 Other pruritus: Secondary | ICD-10-CM | POA: Diagnosis not present

## 2015-05-24 DIAGNOSIS — Z30011 Encounter for initial prescription of contraceptive pills: Secondary | ICD-10-CM

## 2015-05-24 DIAGNOSIS — N898 Other specified noninflammatory disorders of vagina: Secondary | ICD-10-CM

## 2015-05-24 MED ORDER — FLUCONAZOLE 150 MG PO TABS: 150.0000 mg | ORAL_TABLET | Freq: Once | ORAL | Status: DC

## 2015-05-24 MED ORDER — NORETHINDRONE 0.35 MG PO TABS: 1.0000 | ORAL_TABLET | Freq: Every day | ORAL | Status: DC

## 2015-05-24 NOTE — Patient Instructions (Signed)
Follow up prn Start micronor today use condoms

## 2015-05-24 NOTE — Progress Notes (Signed)
Subjective:     Patient ID: Connie Aguilar, female   DOB: 1975/10/22, 39 y.o.   MRN: [id number]  HPI Connie Aguilar is a 39 year old black female in complaining of vaginal itching, she has been taking doxycycline for abscess left thigh and has URI sypmtoms and seen in ER.She also says she wants to get back on Micronor.   Review of Systems Patient denies any headaches, hearing loss, fatigue, blurred vision, shortness of breath, chest pain, abdominal pain, problems with bowel movements, urination, or intercourse. No joint pain or mood swings.See HPI for positives. Reviewed past medical,surgical, social and family history. Reviewed medications and allergies.      Objective:   Physical Exam BP 140/80 mmHg  Pulse 86  Ht 5\' 1"  (1.549 m)  Wt 220 lb 8 oz (100.018 kg)  BMI 41.68 kg/m2  LMP 05/21/2015 Skin warm and dry.Pelvic: external genitalia is normal in appearance no lesions, vagina: period like blood without odor,urethra has no lesions or masses noted, cervix:smooth, uterus: normal size, shape and contour, non tender, no masses felt, adnexa: no masses or tenderness noted. Bladder is non tender and no masses felt. Has healing boil left inner thigh near buttock     Assessment:    Vaginal itch       Contraceptive management Plan:    Use condoms Rx micronor disp 1 pack,take 1 daily with 11 refills Rx diflucan 150 mg #1 take 1 now with 2 refills Follow up rpn

## 2015-05-25 LAB — CULTURE, GROUP A STREP: Strep A Culture: NEGATIVE

## 2016-01-02 ENCOUNTER — Encounter (HOSPITAL_COMMUNITY): Payer: Self-pay | Admitting: Emergency Medicine

## 2016-01-02 ENCOUNTER — Emergency Department (HOSPITAL_COMMUNITY)
Admission: EM | Admit: 2016-01-02 | Discharge: 2016-01-02 | Disposition: A | Discharge status: Home / Self Care | Payer: Commercial Managed Care - PPO | Attending: Emergency Medicine | Admitting: Emergency Medicine

## 2016-01-02 DIAGNOSIS — Z87891 Personal history of nicotine dependence: Secondary | ICD-10-CM | POA: Diagnosis not present

## 2016-01-02 DIAGNOSIS — E785 Hyperlipidemia, unspecified: Secondary | ICD-10-CM | POA: Insufficient documentation

## 2016-01-02 DIAGNOSIS — Z794 Long term (current) use of insulin: Secondary | ICD-10-CM | POA: Insufficient documentation

## 2016-01-02 DIAGNOSIS — N764 Abscess of vulva: Secondary | ICD-10-CM | POA: Insufficient documentation

## 2016-01-02 DIAGNOSIS — Z79899 Other long term (current) drug therapy: Secondary | ICD-10-CM | POA: Insufficient documentation

## 2016-01-02 DIAGNOSIS — I10 Essential (primary) hypertension: Secondary | ICD-10-CM | POA: Insufficient documentation

## 2016-01-02 DIAGNOSIS — E1165 Type 2 diabetes mellitus with hyperglycemia: Secondary | ICD-10-CM | POA: Insufficient documentation

## 2016-01-02 LAB — CBG MONITORING, ED: Glucose-Capillary: 320 mg/dL — ABNORMAL HIGH (ref 65–99)

## 2016-01-02 MED ORDER — KETOROLAC TROMETHAMINE 30 MG/ML IJ SOLN
30.0000 mg | Freq: Once | INTRAMUSCULAR | Status: AC
  Administered 2016-01-02: 30 mg via INTRAMUSCULAR
  Filled 2016-01-02: qty 1

## 2016-01-02 MED ORDER — SULFAMETHOXAZOLE-TRIMETHOPRIM 800-160 MG PO TABS: 1.0000 | ORAL_TABLET | Freq: Two times a day (BID) | ORAL | Status: DC

## 2016-01-02 MED ORDER — SULFAMETHOXAZOLE-TRIMETHOPRIM 800-160 MG PO TABS
1.0000 | ORAL_TABLET | Freq: Once | ORAL | Status: AC
Start: 2016-01-02 — End: 2016-01-02
  Administered 2016-01-02: 1 via ORAL
  Filled 2016-01-02: qty 1

## 2016-01-02 MED ORDER — INSULIN ASPART 100 UNIT/ML ~~LOC~~ SOLN
10.0000 [IU] | Freq: Once | SUBCUTANEOUS | Status: AC
  Administered 2016-01-02: 10 [IU] via SUBCUTANEOUS
  Filled 2016-01-02: qty 1

## 2016-01-02 MED ORDER — HYDROCODONE-ACETAMINOPHEN 5-325 MG PO TABS: 1.0000 | ORAL_TABLET | ORAL | Status: DC | PRN

## 2016-01-02 MED ORDER — LIDOCAINE-EPINEPHRINE (PF) 2 %-1:200000 IJ SOLN
10.0000 mL | Freq: Once | INTRAMUSCULAR | Status: AC
  Administered 2016-01-02: 10 mL
  Filled 2016-01-02: qty 20

## 2016-01-02 NOTE — ED Notes (Signed)
Patient c/o abscess to left labia x7 days. Denies any fevers. Per patient small amount of drainage.

## 2016-01-02 NOTE — ED Notes (Signed)
Pt with abscess to left labia for a week per pt, one Baucom had small amount of drainage.

## 2016-01-02 NOTE — ED Notes (Signed)
Pt verbalized understanding of no driving and to use caution within 4 hours of taking pain meds due to meds cause drowsiness.  Instructed pt to take all of antibiotics as prescribed. 

## 2016-01-02 NOTE — Discharge Instructions (Signed)
Abscess °An abscess (boil or furuncle) is an infected area on or under the skin. This area is filled with yellowish-white fluid (pus) and other material (debris). °HOME CARE  °· Only take medicines as told by your doctor. °· If you were given antibiotic medicine, take it as directed. Finish the medicine even if you start to feel better. °· If gauze is used, follow your doctor's directions for changing the gauze. °· To avoid spreading the infection: °¨ Keep your abscess covered with a bandage. °¨ Wash your hands well. °¨ Do not share personal care items, towels, or whirlpools with others. °¨ Avoid skin contact with others. °· Keep your skin and clothes clean around the abscess. °· Keep all doctor visits as told. °GET HELP RIGHT AWAY IF:  °· You have more pain, puffiness (swelling), or redness in the wound site. °· You have more fluid or blood coming from the wound site. °· You have muscle aches, chills, or you feel sick. °· You have a fever. °MAKE SURE YOU:  °· Understand these instructions. °· Will watch your condition. °· Will get help right away if you are not doing well or get worse. °  °This information is not intended to replace advice given to you by your health care provider. Make sure you discuss any questions you have with your health care provider. °  °Document Released: 11/22/2007 Document Revised: 12/05/2011 Document Reviewed: 08/19/2011 °Elsevier Interactive Patient Education ©2016 Elsevier Inc. ° °

## 2016-01-02 NOTE — ED Provider Notes (Signed)
CSN: [id number]     Arrival date & time 01/02/16  1318 History   First MD Initiated Contact with Patient 01/02/16 1354     Chief Complaint  Patient presents with  . Abscess   Pt is a 40 yo bf with a hx of diabetes here with an abscess to her left labia for 7 days.  Pt has had an abscess in that location in the past, but not recently.  (Consider location/radiation/quality/duration/timing/severity/associated sxs/prior Treatment) The history is provided by the patient.    Past Medical History  Diagnosis Date  . Diabetes mellitus   . GERD (gastroesophageal reflux disease)   . Hypertension   . Bronchitis     hx of as child  . PONV (postoperative nausea and vomiting)   . Pneumonia     hx of as child  . BV (bacterial vaginosis)   . Diabetes (HCC) 09/19/2012  . Contraception management 09/19/2012  . Vaginal discharge 05/20/2013  . Vaginal itching 05/20/2013  . Mood swings (HCC)   . Abnormal uterine bleeding (AUB) 12/31/2013  . Hyperlipidemia    Past Surgical History  Procedure Laterality Date  . Wisdom tooth extraction    . Pars plana vitrectomy  09/13/2011    Procedure: PARS PLANA VITRECTOMY WITH 25 GAUGE;  Surgeon: Edmon Crape, MD;  Location: [facility] OR;  Service: Ophthalmology;  Laterality: Right;  . Pars plana vitrectomy  10/27/2011    Procedure: PARS PLANA VITRECTOMY WITH 25 GAUGE;  Surgeon: Edmon Crape, MD;  Location: [facility] OR;  Service: Ophthalmology;  Laterality: Right;  . Eye surgery  march 2013, laser prp  OU    vitrectomy  . Cataract extraction w/phaco  11/27/2011    Procedure: CATARACT EXTRACTION PHACO AND INTRAOCULAR LENS PLACEMENT (IOC);  Surgeon: Gemma Payor, MD;  Location: [facility] ORS;  Service: Ophthalmology;  Laterality: Left;  CDE:3.02  . Pars plana vitrectomy  02/16/2012    Procedure: PARS PLANA VITRECTOMY WITH 25 GAUGE;  Surgeon: Edmon Crape, MD;  Location: [facility] OR;  Service: Ophthalmology;  Laterality: Left;  Repair Retinal Detachment  . Gas/fluid exchange  02/16/2012   Procedure: GAS/FLUID EXCHANGE;  Surgeon: Edmon Crape, MD;  Location: [facility] OR;  Service: Ophthalmology;  Laterality: Left;  SF6  . Cesarean section     Family History  Problem Relation Age of Onset  . Cancer Mother 76    breast  . Hypertension Mother   . Glaucoma Father   . Diabetes Sister   . Anesthesia problems Neg Hx   . Hypotension Neg Hx   . Malignant hyperthermia Neg Hx   . Pseudochol deficiency Neg Hx   . Diabetes Maternal Grandmother   . Cancer Maternal Grandfather     prostate  . Diabetes Paternal Grandmother   . Diabetes Paternal Grandfather   . Diabetes Sister   . Seizures Daughter   . Bronchitis Brother    Social History  Substance Use Topics  . Smoking status: Former Smoker -- 0.25 packs/Barcellos for 15 years    Types: Cigarettes  . Smokeless tobacco: Never Used  . Alcohol Use: Yes     Comment: occ- <1 a week, wine, beer or mixed drink   OB History    Gravida Para Term Preterm AB TAB SAB Ectopic Multiple Living   Review of Systems  Genitourinary:       Left labial abscess  All other systems reviewed and are negative.  Allergies  Food and Penicillins  Home Medications   Prior to Admission medications   Medication Sig Start Date End Date Taking? Authorizing Provider  atorvastatin (LIPITOR) 20 MG tablet Take 20 mg by mouth at bedtime. 08/11/14  Yes Historical Provider, MD  insulin glargine (LANTUS) 100 UNIT/ML injection Inject 30 Units into the skin at bedtime.    Yes Historical Provider, MD  insulin lispro (HUMALOG) 100 UNIT/ML injection Inject 5 Units into the skin 3 (three) times daily before meals. +SLIDING SCALE   Yes Historical Provider, MD  losartan (COZAAR) 50 MG tablet Take 50 mg by mouth daily.   Yes Historical Provider, MD  metoprolol succinate (TOPROL-XL) 100 MG 24 hr tablet Take 100 mg by mouth daily. Take with or immediately following a meal.   Yes Historical Provider, MD  HYDROcodone-acetaminophen (NORCO/VICODIN) 5-325 MG  tablet Take 1 tablet by mouth every 4 (four) hours as needed. 01/02/16   Jacalyn LefevreJulie Maleki Hippe, MD  sulfamethoxazole-trimethoprim (BACTRIM DS,SEPTRA DS) 800-160 MG tablet Take 1 tablet by mouth 2 (two) times daily. 01/02/16 01/09/16  Jacalyn LefevreJulie Brinlyn Cena, MD   BP 145/81 mmHg  Pulse 99  Temp(Src) 98.4 F (36.9 C) (Oral)  Resp 18  Ht 5\' 1"  (1.549 m)  Wt 220 lb (99.791 kg)  BMI 41.59 kg/m2  SpO2 99%  LMP 01/01/2016 Physical Exam  Constitutional: She is oriented to person, place, and time. She appears well-developed and well-nourished.  HENT:  Head: Normocephalic and atraumatic.  Right Ear: External ear normal.  Left Ear: External ear normal.  Nose: Nose normal.  Mouth/Throat: Oropharynx is clear and moist.  Eyes: Conjunctivae and EOM are normal. Pupils are equal, round, and reactive to light.  Neck: Normal range of motion. Neck supple.  Cardiovascular: Normal rate, regular rhythm, normal heart sounds and intact distal pulses.   Pulmonary/Chest: Effort normal and breath sounds normal.  Abdominal: Soft. Bowel sounds are normal.  Musculoskeletal: Normal range of motion.  Neurological: She is alert and oriented to person, place, and time.  Skin: Skin is warm and dry.  Psychiatric: She has a normal mood and affect. Her behavior is normal. Judgment and thought content normal.  Nursing note and vitals reviewed.   ED Course  .Marland Kitchen.Incision and Drainage Date/Time: 01/02/2016 3:03 PM Performed by: Jacalyn LefevreHAVILAND, Chaynce Schafer Authorized by: Jacalyn LefevreHAVILAND, Ivelis Norgard Consent: Verbal consent obtained. Risks and benefits: risks, benefits and alternatives were discussed Consent given by: patient Patient understanding: patient states understanding of the procedure being performed Patient consent: the patient's understanding of the procedure matches consent given Procedure consent: procedure consent matches procedure scheduled Relevant documents: relevant documents present and verified Test results: test results available and  properly labeled Site marked: the operative site was marked Imaging studies: imaging studies available Patient identity confirmed: verbally with patient Time out: Immediately prior to procedure a "time out" was called to verify the correct patient, procedure, equipment, support staff and site/side marked as required. Type: abscess Body area: anogenital Location details: vulva Anesthesia: local infiltration Local anesthetic: lidocaine 2% with epinephrine Anesthetic total: 2 ml Patient sedated: no Scalpel size: 11 Incision type: elliptical Incision depth: dermal Complexity: simple Drainage: purulent Drainage amount: copious Wound treatment: wound left open Patient tolerance: Patient tolerated the procedure well with no immediate complications   (including critical care time) Labs Review Labs Reviewed  CBG MONITORING, ED - Abnormal; Notable for the following:    Glucose-Capillary 320 (*)    All other components within normal limits    Imaging Review No results found. I have personally  reviewed and evaluated these images and lab results as part of my medical decision-making.   EKG Interpretation None      MDM  Pt given sq insulin for hyperglycemia.  Pt instr to return if worse.  She knows to try to keep tight control over sugar so her wound heals.  Pt given a rx for bactrim and lortab (#15). Final diagnoses:  Left genital labial abscess  Poorly controlled type 2 diabetes mellitus (HCC)      Jacalyn Lefevre, MD 01/02/16 1504

## 2016-01-04 ENCOUNTER — Encounter: Payer: Self-pay | Admitting: Women's Health

## 2016-01-04 ENCOUNTER — Ambulatory Visit (INDEPENDENT_AMBULATORY_CARE_PROVIDER_SITE_OTHER): Payer: Commercial Managed Care - PPO | Admitting: Women's Health

## 2016-01-04 VITALS — BP 120/82 | HR 77 | Ht 61.0 in | Wt 215.0 lb

## 2016-01-04 DIAGNOSIS — L0292 Furuncle, unspecified: Secondary | ICD-10-CM | POA: Diagnosis not present

## 2016-01-04 NOTE — Patient Instructions (Signed)
Continue bactrim, follow up with Dr. Despina HiddenEure on Friday

## 2016-01-04 NOTE — Progress Notes (Signed)
Patient ID: Connie Aguilar, female   DOB: 03/31/1976, 40 y.o.   MRN: [id number]   [facility]  Patient name: Connie Aguilar MRN [id number]  Date of birth: 03/31/1976  CC & HPI:  Connie Aguilar is a 40 y.o. 131P0101 African American female presenting today for report of boil on Lt labia for a little over a week. Went to ED 01/02/16 and had it lanced, and was placed on bactrim bid x 7d, which she has been taking as directed. Feels like maybe it didn't drain completely when lanced, still irritating her, pain has decreased. IDDM, last A1C 9.  Patient's last menstrual period was 01/01/2016.  Pertinent History Reviewed:  Medical & Surgical Hx:   Past medical, surgical, family, and social history reviewed in electronic medical record Medications: Reviewed & Updated - see associated section Allergies: Reviewed in electronic medical record  Objective Findings:  Vitals: BP 120/82 mmHg  Pulse 77  Ht 5\' 1"  (1.549 m)  Wt 215 lb (97.523 kg)  BMI 40.64 kg/m2  LMP 01/01/2016 Body mass index is 40.64 kg/(m^2).  Physical Examination: General appearance - alert, well appearing, and in no distress Lt labia: small healing puncture wound from I&D at top of Lt labia majora, large ~6cm indurated area encompassing almost entire Lt labia majora, no erythema/heat to touch, co-exam w/ LHE d/t IDDM  No results found for this or any previous visit (from the past 24 hour(s)).   Assessment & Plan:  A:   Boil Lt labia majora, s/p I&D 2d ago  IDDM  P:  Continue bactrim bid- will likely need longer course than 7d, LHE to determine when he sees her on Fri  Encouraged to work towards lowering A1C  Return in about 3 days (around 01/07/2016) for F/U w/ LHE.  Marge DuncansBooker, Kimberly Randall CNM, [facility]NP-BC 01/04/2016 12:56 PM

## 2016-01-07 ENCOUNTER — Encounter: Payer: Self-pay | Admitting: Obstetrics & Gynecology

## 2016-01-07 ENCOUNTER — Ambulatory Visit (INDEPENDENT_AMBULATORY_CARE_PROVIDER_SITE_OTHER): Payer: Commercial Managed Care - PPO | Admitting: Obstetrics & Gynecology

## 2016-01-07 VITALS — BP 168/90 | HR 80 | Ht 61.0 in | Wt 214.0 lb

## 2016-01-07 DIAGNOSIS — I10 Essential (primary) hypertension: Secondary | ICD-10-CM

## 2016-01-07 DIAGNOSIS — N764 Abscess of vulva: Secondary | ICD-10-CM

## 2016-01-07 DIAGNOSIS — L0292 Furuncle, unspecified: Secondary | ICD-10-CM

## 2016-01-07 MED ORDER — SULFAMETHOXAZOLE-TRIMETHOPRIM 800-160 MG PO TABS: 1.0000 | ORAL_TABLET | Freq: Two times a day (BID) | ORAL | Status: AC

## 2016-01-07 MED ORDER — SILVER SULFADIAZINE 1 % EX CREA
TOPICAL_CREAM | CUTANEOUS | Status: DC
Start: 2016-01-07 — End: 2017-01-03

## 2016-01-07 NOTE — Progress Notes (Signed)
Patient ID: Connie Aguilar Vasques, female   DOB: [birth year], 40 y.o.   MRN: [id number]      Chief Complaint  Patient presents with  . Follow-up    on boil    Blood pressure (!) 168/90, pulse 80, height 5\' 1"  (1.549 m), weight 214 lb (97.1 kg), last menstrual period 01/01/2016.  39 y.o. G1P0101 Patient's last menstrual period was 01/01/2016. The current method of family planning is .  Subjective Connie Aguilar Smead is a 40 y.o. 441P0101 African American female presenting today for report of boil on Lt labia for a little over a week. Went to ED 01/02/16 and had it lanced, and was placed on bactrim bid x 7d, which she has been taking as directed. Feels like maybe it didn't drain completely when lanced, still irritating her, pain has decreased. IDDM, last A1C 9.   Objective Improved left vulvar abscess no extension, positive response  Pertinent ROS   Labs or studies     Impression Diagnoses this Encounter::   ICD-9-CM ICD-10-CM   1. Boil 680.9 L02.92     Established relevant diagnosis(es): diabetic  Plan/Recommendations: Meds ordered this encounter  Medications  . sulfamethoxazole-trimethoprim (BACTRIM DS,SEPTRA DS) 800-160 MG tablet    Sig: Take 1 tablet by mouth 2 (two) times daily.    Dispense:  14 tablet    Refill:  0  . silver sulfADIAZINE (SILVADENE) 1 % cream    Sig: Apply TID    Dispense:  50 g    Refill:  11    Labs or Scans Ordered: No orders of the defined types were placed in this encounter.   Management:: Continue antibiotics and local therapy  Follow up Return in about 1 week (around 01/14/2016) for Follow up, with Dr Despina HiddenEure.       All questions were answered.

## 2016-01-14 ENCOUNTER — Ambulatory Visit (INDEPENDENT_AMBULATORY_CARE_PROVIDER_SITE_OTHER): Payer: Commercial Managed Care - PPO | Admitting: Obstetrics & Gynecology

## 2016-01-14 ENCOUNTER — Encounter: Payer: Self-pay | Admitting: Obstetrics & Gynecology

## 2016-01-14 VITALS — BP 120/80 | HR 72 | Ht 61.0 in | Wt 212.0 lb

## 2016-01-14 DIAGNOSIS — E1021 Type 1 diabetes mellitus with diabetic nephropathy: Secondary | ICD-10-CM

## 2016-01-14 DIAGNOSIS — L0292 Furuncle, unspecified: Secondary | ICD-10-CM

## 2016-01-14 NOTE — Progress Notes (Signed)
Chief Complaint  Patient presents with  . Follow-up    Boil    Blood pressure 120/80, pulse 72, height  (1.549 m), weight 212 lb (96.2 kg), last menstrual period 01/01/2016.  40 y.o. G1P0101 Patient's last menstrual period was 01/01/2016. The current method of family planning is none.  Subjective Pt with a follicular abscess for the past 3 weeks, seen here the last 2 weeks with significant improvement  Continues to improve Pain is much less No drainage now No fever chills  Objective Improving left vulvar follicular abscess, induration much smaller Closed   Pertinent ROS No burning with urination, frequency or urgency No nausea, vomiting or diarrhea Nor fever chills or other constitutional symptoms   Labs or studies     Impression Diagnoses this Encounter::   ICD-9-CM ICD-10-CM   1. Boil 680.9 L02.92   2. Type 1 diabetes mellitus with nephropathy (HCC) 250.41 E10.21    583.81      Established relevant diagnosis(es): diabetes  Plan/Recommendations: No orders of the defined types were placed in this encounter.   Labs or Scans Ordered: No orders of the defined types were placed in this encounter.   Management:: Finish antibiotics and topical silvadene Re evaluate in 2 weeks for possible removal of remaining indurated area which represents the nidus  Follow up Return in about 2 weeks (around 01/28/2016) for Follow up, with Dr Despina Hidden.        Face to face time:  15 minutes  Greater than 50% of the visit time was spent in counseling and coordination of care with the patient.  The summary and outline of the counseling and care coordination is summarized in the note above.   All questions were answered.  Past Medical History:  Diagnosis Date  . Abnormal uterine bleeding (AUB) 12/31/2013  . Bronchitis    hx of as child  . BV (bacterial vaginosis)   . Contraception management 09/19/2012  . Diabetes (HCC) 09/19/2012  . Diabetes mellitus   .  GERD (gastroesophageal reflux disease)   . Hyperlipidemia   . Hypertension   . Mood swings (HCC)   . Pneumonia    hx of as child  . PONV (postoperative nausea and vomiting)   . Vaginal discharge 05/20/2013  . Vaginal itching 05/20/2013    Past Surgical History:  Procedure Laterality Date  . CATARACT EXTRACTION W/PHACO  11/27/2011   Procedure: CATARACT EXTRACTION PHACO AND INTRAOCULAR LENS PLACEMENT (IOC);  Surgeon: Gemma Payor, MD;  Location: [facility];  Service: Ophthalmology;  Laterality: Left;  CDE:3.02  . CESAREAN SECTION    . EYE SURGERY  march 2013, laser prp  OU   vitrectomy  . GAS/FLUID EXCHANGE  02/16/2012   Procedure: GAS/FLUID EXCHANGE;  Surgeon: Edmon Crape, MD;  Location: [facility] OR;  Service: Ophthalmology;  Laterality: Left;  SF6  . PARS PLANA VITRECTOMY  09/13/2011   Procedure: PARS PLANA VITRECTOMY WITH 25 GAUGE;  Surgeon: Edmon Crape, MD;  Location: [facility] OR;  Service: Ophthalmology;  Laterality: Right;  . PARS PLANA VITRECTOMY  10/27/2011   Procedure: PARS PLANA VITRECTOMY WITH 25 GAUGE;  Surgeon: Edmon Crape, MD;  Location: [facility] OR;  Service: Ophthalmology;  Laterality: Right;  . PARS PLANA VITRECTOMY  02/16/2012   Procedure: PARS PLANA VITRECTOMY WITH 25 GAUGE;  Surgeon: Edmon Crape, MD;  Location: [facility] OR;  Service: Ophthalmology;  Laterality: Left;  Repair Retinal Detachment  . WISDOM TOOTH EXTRACTION      OB  History    Gravida Para Term Preterm AB Living   1 1   1   1    SAB TAB Ectopic Multiple Live Births                    Social History   Social History  . Marital status: Single    Spouse name: N/A  . Number of children: N/A  . Years of education: N/A   Social History Main Topics  . Smoking status: Former Smoker    Packs/Hedges: 0.25    Years: 15.00    Types: Cigarettes  . Smokeless tobacco: Never Used  . Alcohol use Yes     Comment: occ- <1 a week, wine, beer or mixed drink  . Drug use: No  . Sexual activity: Yes    Birth control/ protection: None,  Condom   Other Topics Concern  . None   Social History Narrative  . None    Family History  Problem Relation Age of Onset  . Cancer Mother 27    breast  . Hypertension Mother   . Glaucoma Father   . Diabetes Sister   . Diabetes Maternal Grandmother   . Cancer Maternal Grandfather     prostate  . Diabetes Paternal Grandmother   . Diabetes Paternal Grandfather   . Diabetes Sister   . Seizures Daughter   . Bronchitis Brother   . Anesthesia problems Neg Hx   . Hypotension Neg Hx   . Malignant hyperthermia Neg Hx   . Pseudochol deficiency Neg Hx

## 2016-01-28 ENCOUNTER — Ambulatory Visit: Payer: Commercial Managed Care - PPO | Admitting: Obstetrics & Gynecology

## 2016-02-01 ENCOUNTER — Ambulatory Visit: Payer: Commercial Managed Care - PPO | Admitting: Obstetrics & Gynecology

## 2016-02-08 ENCOUNTER — Encounter: Payer: Self-pay | Admitting: Obstetrics & Gynecology

## 2016-02-08 ENCOUNTER — Ambulatory Visit (INDEPENDENT_AMBULATORY_CARE_PROVIDER_SITE_OTHER): Payer: Commercial Managed Care - PPO | Admitting: Obstetrics & Gynecology

## 2016-02-08 VITALS — BP 150/78 | HR 89 | Ht 61.0 in | Wt 213.8 lb

## 2016-02-08 DIAGNOSIS — B9689 Other specified bacterial agents as the cause of diseases classified elsewhere: Secondary | ICD-10-CM

## 2016-02-08 DIAGNOSIS — E1021 Type 1 diabetes mellitus with diabetic nephropathy: Secondary | ICD-10-CM

## 2016-02-08 DIAGNOSIS — A499 Bacterial infection, unspecified: Secondary | ICD-10-CM | POA: Diagnosis not present

## 2016-02-08 DIAGNOSIS — L0292 Furuncle, unspecified: Secondary | ICD-10-CM

## 2016-02-08 DIAGNOSIS — N760 Acute vaginitis: Secondary | ICD-10-CM

## 2016-02-08 MED ORDER — METRONIDAZOLE 0.75 % VA GEL: VAGINAL | 0 refills | Status: DC

## 2016-02-08 NOTE — Progress Notes (Signed)
      Chief Complaint  Patient presents with  . Follow-up    boil right vulva    Blood pressure (!) 150/78, pulse 89, height 5\' 1"  (1.549 m), weight 213 lb 12.8 oz (97 kg), last menstrual period 01/28/2016.  39 y.o. G1P0101 Patient's last menstrual period was 01/28/2016. The current method of family planning is none.  Subjective Patient is in for recheck of her left vulvar follicular abscess She was last seen on 01/14/2016 with the abscess getting much better She was on local care Bactrim DS and Silvadene cream  She is is insulin-dependent diabetic and her blood sugars have recently been under good control Is in today without complaints  Objective The area on the left vulva looks completely normal and on palpation it's barely indurated all essentially a normal exam with complete resolution of the abscess and induration  Pertinent ROS No burning with urination, frequency or urgency No nausea, vomiting or diarrhea Nor fever chills or other constitutional symptoms   Labs or studies     Impression Diagnoses this Encounter::   ICD-9-CM ICD-10-CM   1. Boil 680.9 L02.92   2. Type 1 diabetes mellitus with nephropathy (HCC) 250.41 E10.21    583.81    3. BV (bacterial vaginosis) 616.10 N76.0    041.9 A49.9     Established relevant diagnosis(es):   Plan/Recommendations: Meds ordered this encounter  Medications  . metroNIDAZOLE (METROGEL VAGINAL) 0.75 % vaginal gel    Sig: Nightly x 5 nights    Dispense:  70 g    Refill:  0    Labs or Scans Ordered: No orders of the defined types were placed in this encounter.   Management:: Patient is encouraged for 5 local care and Silvadene cream if it really nights and the come in ASAP  Follow up Return if symptoms worsen or fail to improve.        All questions were answered.

## 2017-01-03 ENCOUNTER — Encounter: Payer: Self-pay | Admitting: Adult Health

## 2017-01-03 ENCOUNTER — Ambulatory Visit (INDEPENDENT_AMBULATORY_CARE_PROVIDER_SITE_OTHER): Payer: Commercial Managed Care - PPO | Admitting: Adult Health

## 2017-01-03 VITALS — BP 122/70 | HR 76 | Ht 61.0 in | Wt 201.0 lb

## 2017-01-03 DIAGNOSIS — N760 Acute vaginitis: Secondary | ICD-10-CM | POA: Diagnosis not present

## 2017-01-03 DIAGNOSIS — N898 Other specified noninflammatory disorders of vagina: Secondary | ICD-10-CM

## 2017-01-03 DIAGNOSIS — B9689 Other specified bacterial agents as the cause of diseases classified elsewhere: Secondary | ICD-10-CM | POA: Diagnosis not present

## 2017-01-03 LAB — POCT WET PREP (WET MOUNT)
CLUE CELLS WET PREP WHIFF POC: POSITIVE
WBC, Wet Prep HPF POC: POSITIVE

## 2017-01-03 MED ORDER — METRONIDAZOLE 0.75 % VA GEL: 1.0000 | Freq: Every day | VAGINAL | 0 refills | Status: DC

## 2017-01-03 NOTE — Progress Notes (Signed)
Subjective:     Patient ID: Connie Aguilar, female   DOB: [birth year], 41 y.o.   MRN: [id number]  HPI [name] is a 41 year old black female in complaining of increased yellowish discharge, occasional itch, no odor noted, no new sex partners.   Review of Systems Vaginal discharge, occasional itch, no odor noted Reviewed past medical,surgical, social and family history. Reviewed medications and allergies.     Objective:   Physical Exam BP 122/70 (BP Location: Left Arm, Patient Position: Sitting, Cuff Size: Small)   Pulse 76   Ht 5\' 1"  (1.549 m)   Wt 201 lb (91.2 kg)   LMP 12/19/2016   BMI 37.98 kg/m  Skin warm and dry.Pelvic: external genitalia is normal in appearance no lesions, vagina: yeloowish discharge with odor,side walls red,urethra has no lesions or masses noted, cervix: bulbous, uterus: normal size, shape and contour, non tender, no masses felt, adnexa: no masses or tenderness noted. Bladder is non tender and no masses felt. Wet prep: + for clue cells and +WBCs. She declines GC/CHL.     Assessment:     1. Vaginal discharge   2. BV (bacterial vaginosis)       Plan:    No sex during treatment  Meds ordered this encounter  Medications  . metroNIDAZOLE (METROGEL VAGINAL) 0.75 % vaginal gel    Sig: Place 1 Applicatorful vaginally at bedtime.    Dispense:  70 g    Refill:  0    Order Specific Question:   Supervising Provider    Answer:   Duane LopeEURE, LUTHER H [[id number]]  Follow up prn

## 2017-05-16 ENCOUNTER — Ambulatory Visit: Payer: PRIVATE HEALTH INSURANCE | Admitting: Adult Health

## 2017-06-05 ENCOUNTER — Telehealth: Payer: Self-pay | Admitting: *Deleted

## 2017-06-05 MED ORDER — METRONIDAZOLE 0.75 % VA GEL: 1.0000 | Freq: Every day | VAGINAL | 1 refills | Status: DC

## 2017-06-05 MED ORDER — FLUCONAZOLE 150 MG PO TABS: ORAL_TABLET | ORAL | 1 refills | Status: DC

## 2017-06-05 NOTE — Telephone Encounter (Signed)
Pt requests refill on metrogel, has history of BV and requests rx for diflucan,done

## 2017-12-05 ENCOUNTER — Other Ambulatory Visit: Payer: Self-pay | Admitting: Otolaryngology

## 2017-12-05 DIAGNOSIS — G510 Bell's palsy: Secondary | ICD-10-CM

## 2017-12-15 ENCOUNTER — Ambulatory Visit
Admission: RE | Admit: 2017-12-15 | Discharge: 2017-12-15 | Disposition: A | Discharge status: Home / Self Care | Payer: PRIVATE HEALTH INSURANCE | Source: Ambulatory Visit | Attending: Otolaryngology | Admitting: Otolaryngology

## 2017-12-15 DIAGNOSIS — G510 Bell's palsy: Secondary | ICD-10-CM

## 2017-12-15 MED ORDER — GADOBENATE DIMEGLUMINE 529 MG/ML IV SOLN
19.0000 mL | Freq: Once | INTRAVENOUS | Status: AC | PRN
  Administered 2017-12-15: 19 mL via INTRAVENOUS

## 2018-02-27 ENCOUNTER — Telehealth: Payer: Self-pay | Admitting: *Deleted

## 2018-02-27 MED ORDER — METRONIDAZOLE 0.75 % VA GEL: 1.0000 | Freq: Every day | VAGINAL | 1 refills | Status: DC

## 2018-02-27 NOTE — Telephone Encounter (Signed)
Has BV, wants metrogel,and needs pap, will rx metrogel, and she will call back for pap and physical appt.

## 2018-06-24 ENCOUNTER — Other Ambulatory Visit: Payer: PRIVATE HEALTH INSURANCE | Admitting: Adult Health

## 2018-07-02 ENCOUNTER — Encounter: Payer: Self-pay | Admitting: Adult Health

## 2018-07-02 ENCOUNTER — Other Ambulatory Visit (HOSPITAL_COMMUNITY)
Admission: RE | Admit: 2018-07-02 | Discharge: 2018-07-02 | Disposition: A | Discharge status: Home / Self Care | Payer: PRIVATE HEALTH INSURANCE | Source: Ambulatory Visit | Attending: Adult Health | Admitting: Adult Health

## 2018-07-02 ENCOUNTER — Ambulatory Visit (INDEPENDENT_AMBULATORY_CARE_PROVIDER_SITE_OTHER): Payer: PRIVATE HEALTH INSURANCE | Admitting: Adult Health

## 2018-07-02 VITALS — BP 123/77 | HR 81 | Ht 62.0 in | Wt 225.0 lb

## 2018-07-02 DIAGNOSIS — Z1212 Encounter for screening for malignant neoplasm of rectum: Secondary | ICD-10-CM | POA: Diagnosis not present

## 2018-07-02 DIAGNOSIS — Z01419 Encounter for gynecological examination (general) (routine) without abnormal findings: Secondary | ICD-10-CM | POA: Diagnosis not present

## 2018-07-02 DIAGNOSIS — Z1211 Encounter for screening for malignant neoplasm of colon: Secondary | ICD-10-CM | POA: Diagnosis not present

## 2018-07-02 LAB — HEMOCCULT GUIAC POC 1CARD (OFFICE): FECAL OCCULT BLD: NEGATIVE

## 2018-07-02 MED ORDER — METRONIDAZOLE 0.75 % VA GEL: 1.0000 | Freq: Every day | VAGINAL | 2 refills | Status: DC

## 2018-07-02 NOTE — Addendum Note (Signed)
Addended by: Federico Flake A on: 07/02/2018 03:59 PM   Modules accepted: Orders

## 2018-07-02 NOTE — Progress Notes (Signed)
Patient ID: Connie Aguilar, female   DOB: 12/29/1975, 43 y.o.   MRN: [id number] History of Present Illness: Connie Aguilar is a 43 years old black female, single in for well woman gyn exam and pap. PCP is Dr Lucianne Muss.   Current Medications, Allergies, Past Medical History, Past Surgical History, Family History and Social History were reviewed in [employer] record.     Review of Systems:  Patient denies any headaches, hearing loss, fatigue, blurred vision, shortness of breath, chest pain, abdominal pain, problems with bowel movements, urination, or intercourse(not active). No joint pain or mood swings. Recurrent BV,requests refill on Metrogel.She denies, tub baths, thongs or shower gel use.    Physical Exam:BP 123/77 (BP Location: Left Arm, Patient Position: Sitting, Cuff Size: Normal)   Pulse 81   Ht 5\' 2"  (1.575 m)   Wt 225 lb (102.1 kg)   LMP 06/09/2018   BMI 41.15 kg/m  General:  Well developed, well nourished, no acute distress Skin:  Warm and dry Neck:  Midline trachea, normal thyroid, good ROM, no lymphadenopathy Lungs; Clear to auscultation bilaterally Breast:  No dominant palpable mass, retraction, or nipple discharge Cardiovascular: Regular rate and rhythm Abdomen:  Soft, non tender, no hepatosplenomegaly Pelvic:  External genitalia is normal in appearance, no lesions.  The vagina is normal in appearance. Urethra has no lesions or masses. The cervix is bulbous. Pap with HPV performed. Uterus is felt to be normal size, shape, and contour.  No adnexal masses or tenderness noted.Bladder is non tender, no masses felt. Rectal: Good sphincter tone, no polyps, or hemorrhoids felt.  Hemoccult negative. Extremities/musculoskeletal:  No swelling or varicosities noted, no clubbing or cyanosis Psych:  No mood changes, alert and cooperative,seems happy PHQ 2 score 0. Fall risk is low. Examination chaperoned by Federico Flake, CMA. Last A1c 8.9, trying to get to  7.  Impression: 1. Encounter for gynecological examination with Papanicolaou smear of cervix   2. Screening for colorectal cancer       Plan:  Meds ordered this encounter  Medications  . metroNIDAZOLE (METROGEL VAGINAL) 0.75 % vaginal gel    Sig: Place 1 Applicatorful vaginally at bedtime.    Dispense:  70 g    Refill:  2    Order Specific Question:   Supervising Provider    Answer:   Lazaro Arms [[id number]]  Labs with PCP Get mammogram now and yearly, given number to [facility] imaging Physical in 1 year Pap in 3 if normal

## 2018-07-08 ENCOUNTER — Encounter: Payer: Self-pay | Admitting: Adult Health

## 2018-07-08 DIAGNOSIS — R87610 Atypical squamous cells of undetermined significance on cytologic smear of cervix (ASC-US): Secondary | ICD-10-CM

## 2018-07-08 HISTORY — DX: Atypical squamous cells of undetermined significance on cytologic smear of cervix (ASC-US): R87.610

## 2018-07-08 LAB — CYTOLOGY - PAP
Adequacy: ABSENT — AB
Diagnosis: UNDETERMINED — AB
HPV (WINDOPATH): NOT DETECTED

## 2018-08-05 ENCOUNTER — Telehealth: Payer: Self-pay | Admitting: Adult Health

## 2018-08-05 MED ORDER — METRONIDAZOLE 0.75 % VA GEL: 1.0000 | Freq: Every day | VAGINAL | 2 refills | Status: DC

## 2018-08-05 NOTE — Telephone Encounter (Signed)
Pt aware [facility]

## 2018-08-05 NOTE — Telephone Encounter (Signed)
Pt would to Leave a message for Jennifer/about a script that we have sent but they keep saying they do not have. Was for Metrogel can you instead send to [facility] [place] on [address] [phone]

## 2019-05-23 ENCOUNTER — Other Ambulatory Visit: Payer: Self-pay

## 2019-05-23 ENCOUNTER — Emergency Department (HOSPITAL_BASED_OUTPATIENT_CLINIC_OR_DEPARTMENT_OTHER)
Admission: EM | Admit: 2019-05-23 | Discharge: 2019-05-23 | Disposition: A | Discharge status: Home / Self Care | Payer: PRIVATE HEALTH INSURANCE | Attending: Emergency Medicine | Admitting: Emergency Medicine

## 2019-05-23 ENCOUNTER — Encounter (HOSPITAL_BASED_OUTPATIENT_CLINIC_OR_DEPARTMENT_OTHER): Payer: Self-pay | Admitting: *Deleted

## 2019-05-23 DIAGNOSIS — Z794 Long term (current) use of insulin: Secondary | ICD-10-CM | POA: Insufficient documentation

## 2019-05-23 DIAGNOSIS — M791 Myalgia, unspecified site: Secondary | ICD-10-CM | POA: Insufficient documentation

## 2019-05-23 DIAGNOSIS — E119 Type 2 diabetes mellitus without complications: Secondary | ICD-10-CM | POA: Diagnosis not present

## 2019-05-23 DIAGNOSIS — I10 Essential (primary) hypertension: Secondary | ICD-10-CM | POA: Insufficient documentation

## 2019-05-23 DIAGNOSIS — R519 Headache, unspecified: Secondary | ICD-10-CM | POA: Diagnosis present

## 2019-05-23 DIAGNOSIS — Z87891 Personal history of nicotine dependence: Secondary | ICD-10-CM | POA: Insufficient documentation

## 2019-05-23 DIAGNOSIS — M7918 Myalgia, other site: Secondary | ICD-10-CM

## 2019-05-23 DIAGNOSIS — Z79899 Other long term (current) drug therapy: Secondary | ICD-10-CM | POA: Insufficient documentation

## 2019-05-23 MED ORDER — METHOCARBAMOL 500 MG PO TABS: 1000.0000 mg | ORAL_TABLET | Freq: Four times a day (QID) | ORAL | 0 refills | Status: DC

## 2019-05-23 MED FILL — METHOCARBAMOL 500 MG TABLET: 500 | 3 days supply | Qty: 20 | Fill #0
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
## 2019-06-25 ENCOUNTER — Ambulatory Visit: Payer: Commercial Managed Care - PPO | Admitting: Adult Health

## 2019-07-10 ENCOUNTER — Ambulatory Visit: Payer: Commercial Managed Care - PPO | Admitting: Adult Health

## 2019-07-17 ENCOUNTER — Ambulatory Visit: Payer: Commercial Managed Care - PPO | Admitting: Adult Health

## 2020-01-12 ENCOUNTER — Other Ambulatory Visit: Payer: Commercial Managed Care - PPO | Admitting: Adult Health

## 2020-01-12 ENCOUNTER — Ambulatory Visit (INDEPENDENT_AMBULATORY_CARE_PROVIDER_SITE_OTHER): Payer: PRIVATE HEALTH INSURANCE | Admitting: Adult Health

## 2020-01-12 ENCOUNTER — Encounter: Payer: Self-pay | Admitting: Adult Health

## 2020-01-12 ENCOUNTER — Other Ambulatory Visit (HOSPITAL_COMMUNITY)
Admission: RE | Admit: 2020-01-12 | Discharge: 2020-01-12 | Disposition: A | Discharge status: Home / Self Care | Payer: PRIVATE HEALTH INSURANCE | Source: Ambulatory Visit | Attending: Adult Health | Admitting: Adult Health

## 2020-01-12 VITALS — BP 128/82 | HR 85 | Ht 62.0 in | Wt 227.0 lb

## 2020-01-12 DIAGNOSIS — Z01419 Encounter for gynecological examination (general) (routine) without abnormal findings: Secondary | ICD-10-CM | POA: Insufficient documentation

## 2020-01-12 DIAGNOSIS — N946 Dysmenorrhea, unspecified: Secondary | ICD-10-CM | POA: Insufficient documentation

## 2020-01-12 DIAGNOSIS — Z1211 Encounter for screening for malignant neoplasm of colon: Secondary | ICD-10-CM | POA: Diagnosis not present

## 2020-01-12 DIAGNOSIS — N920 Excessive and frequent menstruation with regular cycle: Secondary | ICD-10-CM | POA: Diagnosis not present

## 2020-01-12 LAB — HEMOCCULT GUIAC POC 1CARD (OFFICE): Fecal Occult Blood, POC: NEGATIVE

## 2020-01-12 NOTE — Progress Notes (Signed)
Patient ID: Connie Aguilar, female   DOB: [birth year], 44 y.o.   MRN: [id number] History of Present Illness: Connie Aguilar is a 44 year old black female,single, G1P1 in for well woman gyn exam and pap, pap last year 07/02/2018 was ASCUS with negative HPV. She wants a tubal.  PCP is [employer] PA.   Current Medications, Allergies, Past Medical History, Past Surgical History, Family History and Social History were reviewed in [employer] record.     Review of Systems:  Patient denies any headaches, hearing loss, fatigue, blurred vision, shortness of breath, chest pain, abdominal pain, problems with bowel movements, urination, or intercourse(not currently active) No joint pain or mood swings. Has painful periods and has 5 Crandle periods that are heavier Has facial breakouts from masks   Physical Exam:BP 128/82 (BP Location: Left Arm, Patient Position: Sitting, Cuff Size: Normal)   Pulse 85   Ht 5\' 2"  (1.575 m)   Wt (!) 227 lb (103 kg)   LMP 12/28/2019 (Exact Date)   BMI 41.52 kg/m  General:  Well developed, well nourished, no acute distress Skin:  Warm and dry Neck:  Midline trachea, normal thyroid, good ROM, no lymphadenopathy Lungs; Clear to auscultation bilaterally Breast:  No dominant palpable mass, retraction, or nipple discharge Cardiovascular: Regular rate and rhythm Abdomen:  Soft, non tender, no hepatosplenomegaly Pelvic:  External genitalia is normal in appearance, no lesions.  The vagina is normal in appearance. Urethra has no lesions or masses. The cervix is bulbous.Pap with GC/CHL and high risk HPV 16/18 genotyping performed.  Uterus is felt to be normal size, shape, and contour.  No adnexal masses or tenderness noted.Bladder is non tender, no masses felt. Rectal: Good sphincter tone, no polyps, or hemorrhoids felt.  Hemoccult negative. Extremities/musculoskeletal:  No swelling or varicosities noted, no clubbing or cyanosis Psych:  No mood changes, alert and  cooperative,seems happy AA is s 2 Fall risk is low PHQ 9 score is 7, no SI   Upstream - 01/12/20 1408      Pregnancy Intention Screening   Does the patient want to become pregnant in the next year? No    Does the patient's partner want to become pregnant in the next year? No    Would the patient like to discuss contraceptive options today? Yes      Contraception Wrap Up   Current Method No Method - Other Reason    End Method Female Sterilization    Contraception Counseling Provided Yes         Examination chaperoned by 01/14/20 holland LPN  Impression and Plan: 1. Encounter for gynecological examination with Papanicolaou smear of cervix Pap sent Physical in 1 year Pap in 3 if normal Labs with PCP   2. Encounter for screening fecal occult blood testing   3. Dysmenorrhea Given handout on ablation  4. Menorrhagia with regular cycle Given handout on ablation and discussed ablation and tubal with her Return in 1 week for pre op with Dr Martie Lee

## 2020-01-12 NOTE — Progress Notes (Signed)
Error SH 

## 2020-01-15 LAB — CYTOLOGY - PAP
Chlamydia: NEGATIVE
Comment: NEGATIVE
Comment: NEGATIVE
Comment: NORMAL
Diagnosis: NEGATIVE
High risk HPV: NEGATIVE
Neisseria Gonorrhea: NEGATIVE

## 2020-01-20 ENCOUNTER — Encounter: Payer: Commercial Managed Care - PPO | Admitting: Obstetrics & Gynecology

## 2020-02-09 ENCOUNTER — Ambulatory Visit (INDEPENDENT_AMBULATORY_CARE_PROVIDER_SITE_OTHER): Payer: PRIVATE HEALTH INSURANCE | Admitting: Obstetrics & Gynecology

## 2020-02-09 ENCOUNTER — Encounter: Payer: Self-pay | Admitting: Obstetrics & Gynecology

## 2020-02-09 VITALS — BP 153/89 | HR 84 | Ht 67.0 in | Wt 232.0 lb

## 2020-02-09 DIAGNOSIS — N920 Excessive and frequent menstruation with regular cycle: Secondary | ICD-10-CM | POA: Diagnosis not present

## 2020-02-09 DIAGNOSIS — N946 Dysmenorrhea, unspecified: Secondary | ICD-10-CM | POA: Diagnosis not present

## 2020-02-09 MED ORDER — MEGESTROL ACETATE 40 MG PO TABS: ORAL_TABLET | ORAL | 3 refills | Status: DC

## 2020-02-09 NOTE — Progress Notes (Signed)
Preoperative History and Physical  Connie Aguilar is a 44 y.o. G1P0101 with Patient's last menstrual period was 01/05/2020. admitted for a hysteroscopy uterine curettage Minerva endometrial ablation and bilateral salpingectomy for sterilization.  Worsening menses over the past year or so, always heavyl wears 2 super maxi pads and sleeps on towel, regular 5-6 days, pain is getting significantly worse Ibuprofen helps "a little"    PMH:    Past Medical History:  Diagnosis Date  . Abnormal uterine bleeding (AUB) 12/31/2013  . Atypical squamous cell changes of undetermined significance (ASCUS) on cervical cytology with negative high risk human papilloma virus (HPV) test result 07/08/2018   Repeat in 1 year  . Bronchitis    hx of as child  . BV (bacterial vaginosis)   . Contraception management 09/19/2012  . Diabetes (HCC) 09/19/2012  . Diabetes mellitus   . GERD (gastroesophageal reflux disease)   . Hyperlipidemia   . Hypertension   . Mood swings   . Pneumonia    hx of as child  . PONV (postoperative nausea and vomiting)   . Vaginal discharge 05/20/2013  . Vaginal itching 05/20/2013  . Vaginal Pap smear, abnormal     PSH:     Past Surgical History:  Procedure Laterality Date  . CATARACT EXTRACTION W/PHACO  11/27/2011   Procedure: CATARACT EXTRACTION PHACO AND INTRAOCULAR LENS PLACEMENT (IOC);  Surgeon: Gemma Payor, MD;  Location: [facility];  Service: Ophthalmology;  Laterality: Left;  CDE:3.02  . CESAREAN SECTION    . EYE SURGERY  march 2013, laser prp  OU   vitrectomy  . GAS/FLUID EXCHANGE  02/16/2012   Procedure: GAS/FLUID EXCHANGE;  Surgeon: Edmon Crape, MD;  Location: [facility] OR;  Service: Ophthalmology;  Laterality: Left;  SF6  . PARS PLANA VITRECTOMY  09/13/2011   Procedure: PARS PLANA VITRECTOMY WITH 25 GAUGE;  Surgeon: Edmon Crape, MD;  Location: [facility] OR;  Service: Ophthalmology;  Laterality: Right;  . PARS PLANA VITRECTOMY  10/27/2011   Procedure: PARS PLANA VITRECTOMY WITH 25 GAUGE;   Surgeon: Edmon Crape, MD;  Location: [facility] OR;  Service: Ophthalmology;  Laterality: Right;  . PARS PLANA VITRECTOMY  02/16/2012   Procedure: PARS PLANA VITRECTOMY WITH 25 GAUGE;  Surgeon: Edmon Crape, MD;  Location: [facility] OR;  Service: Ophthalmology;  Laterality: Left;  Repair Retinal Detachment  . WISDOM TOOTH EXTRACTION      POb/GynH:      OB History    Gravida  1   Para  1   Term      Preterm  1   AB      Living  1     SAB      TAB      Ectopic      Multiple      Live Births              SH:   Social History   Tobacco Use  . Smoking status: Former Smoker    Packs/Lutze: 0.25    Years: 15.00    Pack years: 3.75    Types: Cigarettes  . Smokeless tobacco: Never Used  Vaping Use  . Vaping Use: Never used  Substance Use Topics  . Alcohol use: Yes    Comment: occ- <1 a week, wine, beer or mixed drink  . Drug use: No    FH:    Family History  Problem Relation Age of Onset  . Cancer Mother 68  breast  . Hypertension Mother   . Glaucoma Father   . Diabetes Sister   . Diabetes Maternal Grandmother   . Cancer Maternal Grandfather        prostate  . Diabetes Paternal Grandmother   . Diabetes Paternal Grandfather   . Diabetes Sister   . Seizures Daughter   . Bronchitis Brother   . Anesthesia problems Neg Hx   . Hypotension Neg Hx   . Malignant hyperthermia Neg Hx   . Pseudochol deficiency Neg Hx      Allergies:  Shellfish, penicillin  Medications:       Current Outpatient Medications:  .  albuterol (VENTOLIN HFA) 108 (90 Base) MCG/ACT inhaler, Inhale into the lungs., Disp: , Rfl:  .  amLODipine (NORVASC) 5 MG tablet, Take 1 tablet by mouth daily., Disp: , Rfl:  .  atorvastatin (LIPITOR) 20 MG tablet, Take 20 mg by mouth at bedtime., Disp: , Rfl: 11 .  Continuous Blood Gluc Transmit (DEXCOM G4 PLATINUM TRANSMITTER) MISC, USE AS DIRECTED. CHANGE EVERY 90 DAYS., Disp: , Rfl:  .  cyclobenzaprine (FLEXERIL) 5 MG tablet, Take 5 mg by mouth 3  (three) times daily as needed., Disp: , Rfl:  .  hydrochlorothiazide (HYDRODIURIL) 25 MG tablet, Take 1 tablet by mouth daily., Disp: , Rfl:  .  insulin glargine (LANTUS) 100 UNIT/ML injection, Inject 30 Units into the skin at bedtime. , Disp: , Rfl:  .  insulin lispro (HUMALOG) 100 UNIT/ML injection, Inject 5 Units into the skin 3 (three) times daily before meals. +SLIDING SCALE, Disp: , Rfl:  .  latanoprost (XALATAN) 0.005 % ophthalmic solution, , Disp: , Rfl:  .  losartan (COZAAR) 50 MG tablet, Take 50 mg by mouth daily., Disp: , Rfl:  .  metoprolol succinate (TOPROL-XL) 100 MG 24 hr tablet, Take 100 mg by mouth daily. Take with or immediately following a meal., Disp: , Rfl:  .  megestrol (MEGACE) 40 MG tablet, 1 tablet daily, Disp: 30 tablet, Rfl: 3  Review of Systems:   Review of Systems  Constitutional: Negative for fever, chills, weight loss, malaise/fatigue and diaphoresis.  HENT: Negative for hearing loss, ear pain, nosebleeds, congestion, sore throat, neck pain, tinnitus and ear discharge.   Eyes: Negative for blurred vision, double vision, photophobia, pain, discharge and redness.  Respiratory: Negative for cough, hemoptysis, sputum production, shortness of breath, wheezing and stridor.   Cardiovascular: Negative for chest pain, palpitations, orthopnea, claudication, leg swelling and PND.  Gastrointestinal: Positive for abdominal pain. Negative for heartburn, nausea, vomiting, diarrhea, constipation, blood in stool and melena.  Genitourinary: Negative for dysuria, urgency, frequency, hematuria and flank pain.  Musculoskeletal: Negative for myalgias, back pain, joint pain and falls.  Skin: Negative for itching and rash.  Neurological: Negative for dizziness, tingling, tremors, sensory change, speech change, focal weakness, seizures, loss of consciousness, weakness and headaches.  Endo/Heme/Allergies: Negative for environmental allergies and polydipsia. Does not bruise/bleed easily.   Psychiatric/Behavioral: Negative for depression, suicidal ideas, hallucinations, memory loss and substance abuse. The patient is not nervous/anxious and does not have insomnia.      PHYSICAL EXAM:  Blood pressure (!) 153/89, pulse 84, height 5\' 7"  (1.702 m), weight 232 lb (105.2 kg), last menstrual period 01/05/2020.    Vitals reviewed. Constitutional: She is oriented to person, place, and time. She appears well-developed and well-nourished.  HENT:  Head: Normocephalic and atraumatic.  Right Ear: External ear normal.  Left Ear: External ear normal.  Nose: Nose normal.  Mouth/Throat: Oropharynx is  clear and moist.  Eyes: Conjunctivae and EOM are normal. Pupils are equal, round, and reactive to light. Right eye exhibits no discharge. Left eye exhibits no discharge. No scleral icterus.  Neck: Normal range of motion. Neck supple. No tracheal deviation present. No thyromegaly present.  Cardiovascular: Normal rate, regular rhythm, normal heart sounds and intact distal pulses.  Exam reveals no gallop and no friction rub.   No murmur heard. Respiratory: Effort normal and breath sounds normal. No respiratory distress. She has no wheezes. She has no rales. She exhibits no tenderness.  GI: Soft. Bowel sounds are normal. She exhibits no distension and no mass. There is tenderness. There is no rebound and no guarding.  Genitourinary:       Vulva is normal without lesions Vagina is pink moist without discharge Cervix normal in appearance and pap is normal Uterus is normal size, contour, position, consistency, mobility, non-tender Adnexa is negative with normal sized ovaries by sonogram  Musculoskeletal: Normal range of motion. She exhibits no edema and no tenderness.  Neurological: She is alert and oriented to person, place, and time. She has normal reflexes. She displays normal reflexes. No cranial nerve deficit. She exhibits normal muscle tone. Coordination normal.  Skin: Skin is warm and dry.  No rash noted. No erythema. No pallor.  Psychiatric: She has a normal mood and affect. Her behavior is normal. Judgment and thought content normal.    Labs: No results found for this or any previous visit (from the past 336 hour(s)).  EKG: Orders placed or performed during the hospital encounter of 09/13/11  . EKG test  . EKG test  . EKG    Imaging Studies: No results found.    Assessment: Menorrhagia Dysmenorrhea Desires sterilization  Plan: Pending sonogram, proceed with endometrial ablation and bilateral salpingectomy for sterilization     Face to face time:  20 minutes  Greater than 50% of the visit time was spent in counseling and coordination of care with the patient.  The summary and outline of the counseling and care coordination is summarized in the note above.   All questions were answered.  Lazaro Arms 02/09/2020 11:05 AM

## 2020-02-20 ENCOUNTER — Telehealth: Payer: Self-pay | Admitting: *Deleted

## 2020-02-20 ENCOUNTER — Ambulatory Visit (INDEPENDENT_AMBULATORY_CARE_PROVIDER_SITE_OTHER): Payer: PRIVATE HEALTH INSURANCE

## 2020-02-20 DIAGNOSIS — N946 Dysmenorrhea, unspecified: Secondary | ICD-10-CM | POA: Diagnosis not present

## 2020-02-20 DIAGNOSIS — N920 Excessive and frequent menstruation with regular cycle: Secondary | ICD-10-CM | POA: Diagnosis not present

## 2020-02-20 NOTE — Telephone Encounter (Signed)
Pt states her bleeding is worse since starting Megace. She is taking 1 tab a Dougan. Please advise. Thanks!! JSY

## 2020-02-20 NOTE — Progress Notes (Signed)
PELVIC US TA/TV:heterogeneous anteverted/retroflexed uterus,posterior subserosal fibroid 2.3 x 2.6 x 1.8 cm,EEC 12 mm,normal ovaries,no free fluid,no pain during ultrasound,limited view because of the position of the uterus.

## 2020-02-24 ENCOUNTER — Telehealth: Payer: Self-pay | Admitting: Obstetrics & Gynecology

## 2020-02-24 NOTE — Telephone Encounter (Signed)
Left message letting pt know can increase Megace to 2-3 tabs per [place]issel as needed for bleeding management. JSY

## 2020-02-24 NOTE — Telephone Encounter (Signed)
Can Megace make sugar go up? Pt has noticed a difference in her sugar since starting med. Pt was advised to take 2-3 tabs of Megace daily until bleeding stops. Once it stops, can decrease to 1 tab daily. Can send a MyChart message back if you'd like to. Thanks! JSY

## 2020-02-24 NOTE — Telephone Encounter (Signed)
It certainly can

## 2020-02-24 NOTE — Telephone Encounter (Signed)
Pt aware Megace can cause sugar to go up. Pt voiced understanding. JSY

## 2020-02-24 NOTE — Telephone Encounter (Signed)
Pt is returning your call Marylu Lund would like to speak to you about the megase change

## 2020-02-24 NOTE — Telephone Encounter (Signed)
Increase to 2-3 per [place] if needed for bleeding management

## 2020-07-11 ENCOUNTER — Other Ambulatory Visit: Payer: Self-pay

## 2020-07-11 ENCOUNTER — Emergency Department (HOSPITAL_BASED_OUTPATIENT_CLINIC_OR_DEPARTMENT_OTHER)
Admission: EM | Admit: 2020-07-11 | Discharge: 2020-07-11 | Disposition: A | Discharge status: Home / Self Care | Payer: PRIVATE HEALTH INSURANCE | Attending: Emergency Medicine | Admitting: Emergency Medicine

## 2020-07-11 ENCOUNTER — Encounter (HOSPITAL_BASED_OUTPATIENT_CLINIC_OR_DEPARTMENT_OTHER): Payer: Self-pay | Admitting: Emergency Medicine

## 2020-07-11 DIAGNOSIS — Z79899 Other long term (current) drug therapy: Secondary | ICD-10-CM | POA: Insufficient documentation

## 2020-07-11 DIAGNOSIS — I10 Essential (primary) hypertension: Secondary | ICD-10-CM | POA: Diagnosis not present

## 2020-07-11 DIAGNOSIS — U071 COVID-19: Secondary | ICD-10-CM | POA: Diagnosis not present

## 2020-07-11 DIAGNOSIS — Z87891 Personal history of nicotine dependence: Secondary | ICD-10-CM | POA: Insufficient documentation

## 2020-07-11 DIAGNOSIS — E119 Type 2 diabetes mellitus without complications: Secondary | ICD-10-CM | POA: Insufficient documentation

## 2020-07-11 DIAGNOSIS — Z794 Long term (current) use of insulin: Secondary | ICD-10-CM | POA: Insufficient documentation

## 2020-07-11 DIAGNOSIS — R110 Nausea: Secondary | ICD-10-CM | POA: Diagnosis present

## 2020-07-11 LAB — CBG MONITORING, ED: Glucose-Capillary: 158 mg/dL — ABNORMAL HIGH (ref 70–99)

## 2020-07-11 MED ORDER — ONDANSETRON 4 MG PO TBDP
4.0000 mg | ORAL_TABLET | Freq: Once | ORAL | Status: DC
  Filled 2020-07-11: qty 1

## 2020-07-11 MED ORDER — ONDANSETRON 4 MG PO TBDP: 4.0000 mg | ORAL_TABLET | Freq: Three times a day (TID) | ORAL | 0 refills | Status: DC | PRN

## 2020-07-11 MED ORDER — FAMOTIDINE 20 MG PO TABS: 20.0000 mg | ORAL_TABLET | Freq: Two times a day (BID) | ORAL | 0 refills | Status: AC

## 2020-07-11 MED ORDER — FAMOTIDINE 20 MG PO TABS
20.0000 mg | ORAL_TABLET | Freq: Two times a day (BID) | ORAL | 0 refills | Status: DC
Start: 2020-07-11 — End: 2020-07-11

## 2020-07-11 MED ORDER — ONDANSETRON 4 MG PO TBDP
4.0000 mg | ORAL_TABLET | Freq: Once | ORAL | Status: AC
  Administered 2020-07-11: 4 mg via ORAL
  Filled 2020-07-11: qty 1

## 2020-07-11 NOTE — ED Provider Notes (Signed)
[facility] EMERGENCY DEPARTMENT Provider Note   CSN: [id number] Arrival date & time: 07/11/20  1124     History Chief Complaint  Patient presents with  . Covid symptoms    Connie Aguilar is a 45 y.o. female.  Patient with history of diabetes presents to the emergency department for evaluation of COVID symptoms.  Patient is on Zucco 8 of illness.  She initially had fever, chills, body ache and cough however the symptoms have improved.  She continues to have nausea and decreased appetite.  Symptoms are made worse when she eats.  She states that her blood sugars have been running close to normal.  No urinary symptoms.  She denies vomiting or diarrhea.  She was concerned that she has becoming dehydrated. The onset of this condition was acute. The course is constant. Aggravating factors: eating. Alleviating factors: none.          Past Medical History:  Diagnosis Date  . Abnormal uterine bleeding (AUB) 12/31/2013  . Atypical squamous cell changes of undetermined significance (ASCUS) on cervical cytology with negative high risk human papilloma virus (HPV) test result 07/08/2018   Repeat in 1 year  . Bronchitis    hx of as child  . BV (bacterial vaginosis)   . Contraception management 09/19/2012  . Diabetes (HCC) 09/19/2012  . Diabetes mellitus   . GERD (gastroesophageal reflux disease)   . Hyperlipidemia   . Hypertension   . Mood swings   . Pneumonia    hx of as child  . PONV (postoperative nausea and vomiting)   . Vaginal discharge 05/20/2013  . Vaginal itching 05/20/2013  . Vaginal Pap smear, abnormal     Patient Active Problem List   Diagnosis Date Noted  . Encounter for screening fecal occult blood testing 01/12/2020  . Menorrhagia with regular cycle 01/12/2020  . Dysmenorrhea 01/12/2020  . Atypical squamous cell changes of undetermined significance (ASCUS) on cervical cytology with negative high risk human papilloma virus (HPV) test result 07/08/2018  . Screening for  colorectal cancer 07/02/2018  . Encounter for gynecological examination with Papanicolaou smear of cervix 07/02/2018  . Boil 01/04/2016  . Abnormal uterine bleeding (AUB) 12/31/2013  . BV (bacterial vaginosis) 12/31/2013  . Vaginal discharge 05/20/2013  . Vaginal itching 05/20/2013  . Diabetes (HCC) 09/19/2012  . Hypertension 09/19/2012  . Difficulty in walking(719.7) 07/02/2012    Past Surgical History:  Procedure Laterality Date  . CATARACT EXTRACTION W/PHACO  11/27/2011   Procedure: CATARACT EXTRACTION PHACO AND INTRAOCULAR LENS PLACEMENT (IOC);  Surgeon: Gemma Payor, MD;  Location: [facility];  Service: Ophthalmology;  Laterality: Left;  CDE:3.02  . CESAREAN SECTION    . EYE SURGERY  march 2013, laser prp  OU   vitrectomy  . GAS/FLUID EXCHANGE  02/16/2012   Procedure: GAS/FLUID EXCHANGE;  Surgeon: Edmon Crape, MD;  Location: [facility] OR;  Service: Ophthalmology;  Laterality: Left;  SF6  . PARS PLANA VITRECTOMY  09/13/2011   Procedure: PARS PLANA VITRECTOMY WITH 25 GAUGE;  Surgeon: Edmon Crape, MD;  Location: [facility] OR;  Service: Ophthalmology;  Laterality: Right;  . PARS PLANA VITRECTOMY  10/27/2011   Procedure: PARS PLANA VITRECTOMY WITH 25 GAUGE;  Surgeon: Edmon Crape, MD;  Location: [facility] OR;  Service: Ophthalmology;  Laterality: Right;  . PARS PLANA VITRECTOMY  02/16/2012   Procedure: PARS PLANA VITRECTOMY WITH 25 GAUGE;  Surgeon: Edmon Crape, MD;  Location: [facility] OR;  Service: Ophthalmology;  Laterality: Left;  Repair Retinal Detachment  .  WISDOM TOOTH EXTRACTION       OB History    Gravida  1   Para  1   Term      Preterm  1   AB      Living  1     SAB      IAB      Ectopic      Multiple      Live Births              Family History  Problem Relation Age of Onset  . Cancer Mother 67       breast  . Hypertension Mother   . Glaucoma Father   . Diabetes Sister   . Diabetes Maternal Grandmother   . Cancer Maternal Grandfather        prostate  . Diabetes  Paternal Grandmother   . Diabetes Paternal Grandfather   . Diabetes Sister   . Seizures Daughter   . Bronchitis Brother   . Anesthesia problems Neg Hx   . Hypotension Neg Hx   . Malignant hyperthermia Neg Hx   . Pseudochol deficiency Neg Hx     Social History   Tobacco Use  . Smoking status: Former Smoker    Packs/[place]arski: 0.25    Years: 15.00    Pack years: 3.75    Types: Cigarettes  . Smokeless tobacco: Never Used  Vaping Use  . Vaping Use: Never used  Substance Use Topics  . Alcohol use: Yes    Comment: occ- <1 a week, wine, beer or mixed drink  . Drug use: No    Home Medications Prior to Admission medications   Medication Sig Start Date End Date Taking? Authorizing Provider  famotidine (PEPCID) 20 MG tablet Take 1 tablet (20 mg total) by mouth 2 (two) times daily. 07/11/20  Yes Renne Crigler, PA-C  ondansetron (ZOFRAN ODT) 4 MG disintegrating tablet Take 1 tablet (4 mg total) by mouth every 8 (eight) hours as needed for nausea or vomiting. 07/11/20  Yes Renne Crigler, PA-C  albuterol (VENTOLIN HFA) 108 (90 Base) MCG/ACT inhaler Inhale into the lungs. 07/24/18   [provider]  amLODipine (NORVASC) 5 MG tablet Take 1 tablet by mouth daily. 09/24/19 09/23/20  [provider]  atorvastatin (LIPITOR) 20 MG tablet Take 20 mg by mouth at bedtime. 08/11/14   [provider]  Continuous Blood Gluc Transmit (DEXCOM G4 PLATINUM TRANSMITTER) MISC USE AS DIRECTED. CHANGE EVERY 90 DAYS. 11/28/19 11/27/20  [provider]  cyclobenzaprine (FLEXERIL) 5 MG tablet Take 5 mg by mouth 3 (three) times daily as needed. 12/05/19   [provider]  hydrochlorothiazide (HYDRODIURIL) 25 MG tablet Take 1 tablet by mouth daily. 06/03/19 06/02/20  [provider]  insulin glargine (LANTUS) 100 UNIT/ML injection Inject 30 Units into the skin at bedtime.     [provider]  insulin lispro (HUMALOG) 100 UNIT/ML injection Inject 5 Units into the skin 3  (three) times daily before meals. +SLIDING SCALE    [provider]  latanoprost (XALATAN) 0.005 % ophthalmic solution  02/14/19   [provider]  losartan (COZAAR) 50 MG tablet Take 50 mg by mouth daily.    [provider]  megestrol (MEGACE) 40 MG tablet 1 tablet daily 02/09/20   Lazaro Arms, MD  metoprolol succinate (TOPROL-XL) 100 MG 24 hr tablet Take 100 mg by mouth daily. Take with or immediately following a meal.    [provider]    Allergies  Food and Penicillins  Review of Systems   Review of Systems  Constitutional: Positive for appetite change. Negative for fever.  HENT: Negative for rhinorrhea and sore throat.   Eyes: Negative for redness.  Respiratory: Positive for cough.   Cardiovascular: Negative for chest pain.  Gastrointestinal: Positive for nausea. Negative for abdominal pain, diarrhea and vomiting.  Genitourinary: Negative for dysuria, frequency, hematuria and urgency.  Musculoskeletal: Negative for myalgias.  Skin: Negative for rash.  Neurological: Negative for headaches.    Physical Exam Updated Vital Signs BP 135/81 (BP Location: Right Arm)   Pulse 91   Temp 98.2 F (36.8 C) (Oral)   Resp 16   Ht 5\' 2"  (1.575 m)   Wt 98.9 kg   LMP 07/04/2020   SpO2 100%   BMI 39.87 kg/m   Physical Exam Vitals and nursing note reviewed.  Constitutional:      General: She is not in acute distress.    Appearance: She is well-developed.  HENT:     Head: Normocephalic and atraumatic.     Right Ear: External ear normal.     Left Ear: External ear normal.     Nose: Nose normal.     Mouth/Throat:     Mouth: Mucous membranes are moist.     Comments: Mucous membranes are moist. Eyes:     Conjunctiva/sclera: Conjunctivae normal.  Cardiovascular:     Rate and Rhythm: Normal rate and regular rhythm.     Heart sounds: No murmur heard.   Pulmonary:     Effort: No respiratory distress.     Breath sounds: No wheezing, rhonchi  or rales.  Abdominal:     Palpations: Abdomen is soft.     Tenderness: There is no abdominal tenderness. There is no guarding or rebound.  Musculoskeletal:     Cervical back: Normal range of motion and neck supple.     Right lower leg: No edema.     Left lower leg: No edema.  Skin:    General: Skin is warm and dry.     Findings: No rash.     Comments: Normal skin turgor  Neurological:     General: No focal deficit present.     Mental Status: She is alert. Mental status is at baseline.     Motor: No weakness.  Psychiatric:        Mood and Affect: Mood normal.     ED Results / Procedures / Treatments   Labs (all labs ordered are listed, but only abnormal results are displayed) Labs Reviewed  CBG MONITORING, ED - Abnormal; Notable for the following components:      Result Value   Glucose-Capillary 158 (*)    All other components within normal limits    EKG None  Radiology No results found.  Procedures Procedures (including critical care time)  Medications Ordered in ED Medications  ondansetron (ZOFRAN-ODT) disintegrating tablet 4 mg (has no administration in time range)  ondansetron (ZOFRAN-ODT) disintegrating tablet 4 mg (has no administration in time range)    ED Course  I have reviewed the triage vital signs and the nursing notes.  Pertinent labs & imaging results that were available during my care of the patient were reviewed by me and considered in my medical decision making (see chart for details).  Patient seen and examined.  Patient overall appears well with normal vitals.  Blood sugar is 158.  She will be given Zofran and Pepcid for symptom control.  Encouraged continued hydration and rest.  Vital signs reviewed and are as follows: BP 135/81 (BP Location: Right Arm)   Pulse 91   Temp 98.2 F (36.8 C) (Oral)   Resp 16   Ht 5\' 2"  (1.575 m)   Wt 98.9 kg   LMP 07/04/2020   SpO2 100%   BMI 39.87 kg/m   Encouraged return emergency department if she  develops abdominal pain, vomiting, diarrhea or if her sugars become markedly elevated.  Daelynn Blower Cannella was evaluated in Emergency Department on 07/11/2020 for the symptoms described in the history of present illness. She was evaluated in the context of the global COVID-19 pandemic, which necessitated consideration that the patient might be at risk for infection with the SARS-CoV-2 virus that causes COVID-19. Institutional protocols and algorithms that pertain to the evaluation of patients at risk for COVID-19 are in a state of rapid change based on information released by regulatory bodies including the CDC and federal and state organizations. These policies and algorithms were followed during the patient's care in the ED.    MDM Rules/Calculators/A&P                          Patient with likely improving COVID-19 symptoms however she continues to have nausea and decreased appetite.  She does not appear to be significantly dehydrated today.  She is tolerating p.o. fluids without vomiting or diarrhea.  Symptom control as above.  Vital signs are reassuring.  Blood sugar minimally elevated and I doubt DKA given patient presentation.    Final Clinical Impression(s) / ED Diagnoses Final diagnoses:  COVID-19  Nausea    Rx / DC Orders ED Discharge Orders         Ordered    famotidine (PEPCID) 20 MG tablet  2 times daily        07/11/20 1509    ondansetron (ZOFRAN ODT) 4 MG disintegrating tablet  Every 8 hours PRN        07/11/20 1518           07/13/20, PA-C 07/11/20 1525    07/13/20, MD 07/12/20 1152

## 2020-07-11 NOTE — ED Notes (Signed)
CBG 158MG /DL

## 2020-07-11 NOTE — ED Notes (Signed)
ED Provider at bedside. 

## 2020-07-11 NOTE — Discharge Instructions (Signed)
Please read and follow all provided instructions.  Your diagnoses today include:  1. COVID-19   2. Nausea     Tests performed today include:  Blood sugar - slightly high  Vital signs. See below for your results today.   Medications prescribed:   Zofran (ondansetron) - for nausea and vomiting   Pepcid (famotidine) - antihistamine  You can find this medication over-the-counter.   DO NOT exceed:   20mg  Pepcid every 12 hours  Take any prescribed medications only as directed.  Home care instructions:  Follow any educational materials contained in this packet.  BE VERY CAREFUL not to take multiple medicines containing Tylenol (also called acetaminophen). Doing so can lead to an overdose which can damage your liver and cause liver failure and possibly death.   Follow-up instructions: Please follow-up with your primary care provider in the next 3 days for further evaluation of your symptoms.   Return instructions:   Please return to the Emergency Department if you experience worsening symptoms.   Return if you develop worsening shortness of breath or cough  Return if you have persistent vomiting or develop abdominal pain or persistent diarrhea  Please return if you have any other emergent concerns.  Additional Information:  Your vital signs today were: BP 138/85 (BP Location: Right Arm)   Pulse 93   Temp 98.2 F (36.8 C) (Oral)   Resp 18   Ht 5\' 2"  (1.575 m)   Wt 98.9 kg   LMP 07/04/2020   SpO2 100%   BMI 39.87 kg/m  If your blood pressure (BP) was elevated above 135/85 this visit, please have this repeated by your doctor within one month. --------------

## 2020-07-11 NOTE — ED Triage Notes (Signed)
Pt arrives pov with c/o fatigue a decreased appetite. Pt also c/o feeling lightheaded, reports decreased intake of fluids.  Pt endorse Covid+ x 1 week.

## 2021-01-14 ENCOUNTER — Other Ambulatory Visit: Payer: Self-pay

## 2021-01-14 ENCOUNTER — Ambulatory Visit (INDEPENDENT_AMBULATORY_CARE_PROVIDER_SITE_OTHER): Payer: No Typology Code available for payment source | Admitting: Adult Health

## 2021-01-14 ENCOUNTER — Encounter: Payer: Self-pay | Admitting: Adult Health

## 2021-01-14 VITALS — BP 122/80 | HR 85 | Ht 61.5 in | Wt 229.0 lb

## 2021-01-14 DIAGNOSIS — Z01419 Encounter for gynecological examination (general) (routine) without abnormal findings: Secondary | ICD-10-CM

## 2021-01-14 DIAGNOSIS — Z1211 Encounter for screening for malignant neoplasm of colon: Secondary | ICD-10-CM | POA: Diagnosis not present

## 2021-01-14 LAB — HEMOCCULT GUIAC POC 1CARD (OFFICE): Fecal Occult Blood, POC: NEGATIVE

## 2021-01-14 NOTE — Progress Notes (Signed)
Patient ID: Connie Aguilar, female   DOB: June 10, 1976, 45 y.o.   MRN: [id number] History of Present Illness: Connie Aguilar is a 45 year old black female,single, G1P0101, in for a well woman gyn exam. She is having sleeve surgery end of August.  Lab Results  Component Value Date   DIAGPAP  01/12/2020    - Negative for intraepithelial lesion or malignancy (NILM)   HPV NOT DETECTED 07/02/2018   HPVHIGH Negative 01/12/2020     Current Medications, Allergies, Past Medical History, Past Surgical History, Family History and Social History were reviewed in [employer] record.     Review of Systems:  Patient denies any headaches, hearing loss, fatigue, blurred vision, shortness of breath, chest pain, abdominal pain, problems with bowel movements, urination, or intercourse. No joint pain or mood swings.    Physical Exam:BP 122/80 (BP Location: Left Arm, Patient Position: Sitting, Cuff Size: Large)   Pulse 85   Ht 5' 1.5" (1.562 m)   Wt 229 lb (103.9 kg)   LMP 12/19/2020   BMI 42.57 kg/m   General:  Well developed, well nourished, no acute distress Skin:  Warm and dry Neck:  Midline trachea, normal thyroid, good ROM, no lymphadenopathy Lungs; Clear to auscultation bilaterally Breast:  No dominant palpable mass, retraction, or nipple discharge Cardiovascular: Regular rate and rhythm Abdomen:  Soft, non tender, no hepatosplenomegaly Pelvic:  External genitalia is normal in appearance, no lesions.  The vagina is normal in appearance. Urethra has no lesions or masses. The cervix is bulbous.  Uterus is felt to be normal size, shape, and contour.  No adnexal masses or tenderness noted.Bladder is non tender, no masses felt. Rectal: Good sphincter tone, no polyps, or hemorrhoids felt.  Hemoccult negative. Extremities/musculoskeletal:  No swelling or varicosities noted, no clubbing or cyanosis Psych:  No mood changes, alert and cooperative,seems happy AA is 1 Fall risk is low Depression  screen [facility] 2/9 01/14/2021 01/12/2020 07/02/2018  Decreased Interest 1 1 0  Down, Depressed, Hopeless 0 1 0  PHQ - 2 Score 1 2 0  Altered sleeping 2 0 -  Tired, decreased energy 1 2 -  Change in appetite 0 2 -  Feeling bad or failure about yourself  0 0 -  Trouble concentrating 1 1 -  Moving slowly or fidgety/restless 1 0 -  Suicidal thoughts 0 0 -  PHQ-9 Score 6 7 -  Difficult doing work/chores - Not difficult at all -    GAD 7 : Generalized Anxiety Score 01/14/2021 01/12/2020  Nervous, Anxious, on Edge 1 0  Control/stop worrying 1 2  Worry too much - different things 1 2  Trouble relaxing 1 2  Restless 0 0  Easily annoyed or irritable 2 1  Afraid - awful might happen 0 0  Total GAD 7 Score 6 7  Anxiety Difficulty - Not difficult at all      Upstream - 01/14/21 1152       Pregnancy Intention Screening   Does the patient want to become pregnant in the next year? No    Does the patient's partner want to become pregnant in the next year? No    Would the patient like to discuss contraceptive options today? Yes      Contraception Wrap Up   Current Method No Method - Other Reason    End Method Female Condom    Contraception Counseling Provided Yes            Examination chaperoned by  Malachy Mood LPN   Impression and Plan: 1. Encounter for well woman exam with routine gynecological exam Physical in 1 year Pap in 2024 Mammogram yearly Labs with PCP  2. Encounter for screening fecal occult blood testing

## 2021-06-09 ENCOUNTER — Other Ambulatory Visit: Payer: Self-pay | Admitting: Adult Health

## 2021-06-09 MED ORDER — METRONIDAZOLE 0.75 % VA GEL: 1.0000 | Freq: Every day | VAGINAL | 0 refills | Status: AC

## 2021-06-09 NOTE — Progress Notes (Signed)
Will x metrogel

## 2021-11-25 ENCOUNTER — Ambulatory Visit (INDEPENDENT_AMBULATORY_CARE_PROVIDER_SITE_OTHER): Payer: Self-pay | Admitting: Obstetrics & Gynecology

## 2021-11-25 ENCOUNTER — Encounter: Payer: Self-pay | Admitting: Obstetrics & Gynecology

## 2021-11-25 VITALS — BP 130/80 | HR 74 | Ht 61.5 in | Wt 171.4 lb

## 2021-11-25 DIAGNOSIS — N764 Abscess of vulva: Secondary | ICD-10-CM

## 2021-11-25 MED ORDER — SULFAMETHOXAZOLE-TRIMETHOPRIM 800-160 MG PO TABS: 1.0000 | ORAL_TABLET | Freq: Two times a day (BID) | ORAL | 0 refills | Status: AC

## 2021-11-25 NOTE — Progress Notes (Signed)
   GYN VISIT Patient name: Connie Aguilar MRN [id number]  Date of birth: Dec 11, 1975 Chief Complaint:   abcess in vaginal area  History of Present Illness:   Connie Aguilar is a 46 y.o. G95P0101 female being seen today for the following:     Abscess: Noticed a bump on her labia last week, by the end of the week it had gotten bigger despite warm compresses. It was not draining.  Seen by PCP, started on Bactrim x 1wk and not sure if it really changed in size; however, it is no longer painful, some itching.  No vaginal discharge. Denies fever/chills.  No other acute complaints  Patient's last menstrual period was 10/27/2021.     01/14/2021   11:39 AM 01/12/2020    2:09 PM 07/02/2018    3:15 PM 01/03/2017    3:02 PM 11/05/2014    8:25 AM  Depression screen PHQ 2/9  Decreased Interest 1 1 0 0 0  Down, Depressed, Hopeless 0 1 0 0 0  PHQ - 2 Score 1 2 0 0 0  Altered sleeping 2 0     Tired, decreased energy 1 2     Change in appetite 0 2     Feeling bad or failure about yourself  0 0     Trouble concentrating 1 1     Moving slowly or fidgety/restless 1 0     Suicidal thoughts 0 0     PHQ-9 Score 6 7     Difficult doing work/chores  Not difficult at all        Review of Systems:   Pertinent items are noted in HPI Denies fever/chills, dizziness, headaches, visual disturbances, fatigue, shortness of breath, chest pain, abdominal pain, vomiting, denies problems with periods, bowel movements, urination, or intercourse unless otherwise stated above.  Pertinent History Reviewed:  Reviewed past medical,surgical, social, obstetrical and family history.  Reviewed problem list, medications and allergies. Physical Assessment:   Vitals:   11/25/21 0926  BP: 130/80  Pulse: 74  Weight: 171 lb 6.4 oz (77.7 kg)  Height: 5' 1.5" (1.562 m)  Body mass index is 31.86 kg/m.       Physical Examination:   General appearance: alert, well appearing, and in no distress  Psych: mood appropriate, normal  affect  Skin: warm & dry   Cardiovascular: normal heart rate noted  Respiratory: normal respiratory effort, no distress  Abdomen: soft, non-tender   Pelvic: right upper labia majora/mons 3x1cm area of induration, no fluctuation- non-tender- no erythema, no drainage  Extremities: no edema   Chaperone: Malachy Mood    Assessment & Plan:  1) Vulvar abscess -based on exam do not think I&D indicated as it is not fluctuant, suspect loculated -plan to continue with warm compresses and antibiotics for another week -if increased in size, pain returns or other acute changes- RTC   Meds ordered this encounter  Medications   sulfamethoxazole-trimethoprim (BACTRIM DS) 800-160 MG tablet    Sig: Take 1 tablet by mouth 2 (two) times daily for 10 days.    Dispense:  20 tablet    Refill:  0      Return if symptoms worsen or fail to improve.   Myna Hidalgo, DO Attending Obstetrician & Gynecologist, [facility] for [employer], [facility]

## 2022-05-25 ENCOUNTER — Ambulatory Visit: Payer: Managed Care, Other (non HMO) | Admitting: Adult Health
